# Patient Record
Sex: Female | Born: 1955 | Race: Black or African American | Hispanic: No | Marital: Single | Smoking: Never smoker
Health system: Southern US, Community
[De-identification: ages and names within clinical notes are randomized; demographics above are authoritative.]

## PROBLEM LIST (undated history)

## (undated) DIAGNOSIS — E119 Type 2 diabetes mellitus without complications: Secondary | ICD-10-CM

## (undated) DIAGNOSIS — Z87442 Personal history of urinary calculi: Secondary | ICD-10-CM

## (undated) DIAGNOSIS — M1990 Unspecified osteoarthritis, unspecified site: Secondary | ICD-10-CM

## (undated) DIAGNOSIS — Z8719 Personal history of other diseases of the digestive system: Secondary | ICD-10-CM

## (undated) DIAGNOSIS — I10 Essential (primary) hypertension: Secondary | ICD-10-CM

## (undated) HISTORY — PX: UTERINE FIBROID SURGERY: SHX826

## (undated) HISTORY — PX: APPENDECTOMY: SHX54

## (undated) HISTORY — PX: HERNIA REPAIR: SHX51

## (undated) HISTORY — PX: ABDOMINAL HYSTERECTOMY: SHX81

---

## 2009-06-05 ENCOUNTER — Emergency Department (HOSPITAL_BASED_OUTPATIENT_CLINIC_OR_DEPARTMENT_OTHER): Admission: EM | Admit: 2009-06-05 | Discharge: 2009-06-06 | Payer: Self-pay | Admitting: Emergency Medicine

## 2009-06-05 ENCOUNTER — Ambulatory Visit: Payer: Self-pay | Admitting: Diagnostic Radiology

## 2010-05-19 LAB — URINE MICROSCOPIC-ADD ON

## 2010-05-19 LAB — CBC
Hemoglobin: 12.6 g/dL (ref 12.0–15.0)
Platelets: 266 10*3/uL (ref 150–400)
WBC: 7.1 10*3/uL (ref 4.0–10.5)

## 2010-05-19 LAB — COMPREHENSIVE METABOLIC PANEL
ALT: 10 U/L (ref 0–35)
Albumin: 4.2 g/dL (ref 3.5–5.2)
BUN: 19 mg/dL (ref 6–23)
CO2: 29 mEq/L (ref 19–32)
Chloride: 106 mEq/L (ref 96–112)
GFR calc Af Amer: >60 mL/min (ref >60)
GFR calc non Af Amer: 58 mL/min — ABNORMAL LOW (ref >60)
Total Bilirubin: 0.4 mg/dL (ref 0.3–1.2)

## 2010-05-19 LAB — URINALYSIS, ROUTINE W REFLEX MICROSCOPIC
Ketones, ur: NEGATIVE mg/dL
Urobilinogen, UA: 1 mg/dL (ref 0.0–1.0)
pH: 6.5 (ref 5.0–8.0)

## 2010-05-19 LAB — POCT CARDIAC MARKERS: Myoglobin, poc: 36.7 ng/mL (ref 12–200)

## 2010-05-19 LAB — DIFFERENTIAL
Basophils Absolute: 0.1 10*3/uL (ref 0.0–0.1)
Basophils Relative: 1 % (ref 0–1)
Eosinophils Absolute: 0.3 10*3/uL (ref 0.0–0.7)
Lymphs Abs: 3.4 10*3/uL (ref 0.7–4.0)
Monocytes Absolute: 0.7 10*3/uL (ref 0.1–1.0)
Monocytes Relative: 11 % (ref 3–12)
Neutro Abs: 2.6 10*3/uL (ref 1.7–7.7)
Neutrophils Relative %: 37 % — ABNORMAL LOW (ref 43–77)

## 2010-05-19 LAB — URINE CULTURE

## 2010-05-19 LAB — LIPASE, BLOOD: Lipase: 143 U/L (ref 23–300)

## 2011-12-22 ENCOUNTER — Encounter (HOSPITAL_BASED_OUTPATIENT_CLINIC_OR_DEPARTMENT_OTHER): Payer: Self-pay | Admitting: *Deleted

## 2011-12-22 ENCOUNTER — Emergency Department (HOSPITAL_BASED_OUTPATIENT_CLINIC_OR_DEPARTMENT_OTHER)
Admission: EM | Admit: 2011-12-22 | Discharge: 2011-12-23 | Disposition: A | Discharge status: Home / Self Care | Payer: BC Managed Care – PPO | Attending: Emergency Medicine | Admitting: Emergency Medicine

## 2011-12-22 ENCOUNTER — Emergency Department (HOSPITAL_BASED_OUTPATIENT_CLINIC_OR_DEPARTMENT_OTHER): Payer: BC Managed Care – PPO

## 2011-12-22 DIAGNOSIS — R296 Repeated falls: Secondary | ICD-10-CM | POA: Insufficient documentation

## 2011-12-22 DIAGNOSIS — Y9289 Other specified places as the place of occurrence of the external cause: Secondary | ICD-10-CM | POA: Insufficient documentation

## 2011-12-22 DIAGNOSIS — S93409A Sprain of unspecified ligament of unspecified ankle, initial encounter: Secondary | ICD-10-CM | POA: Insufficient documentation

## 2011-12-22 DIAGNOSIS — Y9301 Activity, walking, marching and hiking: Secondary | ICD-10-CM | POA: Insufficient documentation

## 2011-12-22 DIAGNOSIS — E119 Type 2 diabetes mellitus without complications: Secondary | ICD-10-CM | POA: Insufficient documentation

## 2011-12-22 DIAGNOSIS — I10 Essential (primary) hypertension: Secondary | ICD-10-CM | POA: Insufficient documentation

## 2011-12-22 HISTORY — DX: Type 2 diabetes mellitus without complications: E11.9

## 2011-12-22 HISTORY — DX: Essential (primary) hypertension: I10

## 2011-12-22 NOTE — ED Notes (Signed)
Fall last night while wearing heels. She stepped off the driveway into grass. Twisted her left foot. Pain in her foot and ankle.

## 2011-12-23 MED ORDER — HYDROCODONE-ACETAMINOPHEN 5-325 MG PO TABS
1.0000 | ORAL_TABLET | Freq: Once | ORAL | Status: AC
  Administered 2011-12-23: 1 via ORAL
  Filled 2011-12-23: qty 1

## 2011-12-23 MED ORDER — HYDROCODONE-ACETAMINOPHEN 5-325 MG PO TABS: 1.0000 | ORAL_TABLET | Freq: Four times a day (QID) | ORAL | Status: DC | PRN

## 2011-12-23 NOTE — ED Provider Notes (Signed)
History     CSN: [id number]  Arrival date & time 12/22/11  2139   First MD Initiated Contact with Patient 12/22/11 2243      Chief Complaint  Patient presents with  . Fall    (Consider location/radiation/quality/duration/timing/severity/associated sxs/prior treatment) Patient is a 56 y.o. female presenting with fall. The history is provided by the patient.  Fall The accident occurred yesterday. The fall occurred while walking. Pertinent negatives include no fever, no abdominal pain, no nausea, no vomiting, no hematuria and no headaches.   patient inverted her left ankle and twisted her foot while walking in high heels she stepped off the driveway into the grass. Pain in her foot and ankle. This occurred more than 24 hours ago. Patient did go to work last evening and the pain got worse and the swelling got worse. Patient's now concerned that she has a fracture. No other injuries. The foot pain is 10 out of 10 she is able to walk on it and able to stand up on the foot with some discomfort not severe discomfort. No proximal fibular tenderness no radiation of pain into the proximal leg.  Past Medical History  Diagnosis Date  . Diabetes mellitus without complication   . Hypertension     History reviewed. No pertinent past surgical history.  No family history on file.  History  Substance Use Topics  . Smoking status: Never Smoker   . Smokeless tobacco: Not on file  . Alcohol Use: No    OB History    Grav Para Term Preterm Abortions TAB SAB Ect Mult Living                  Review of Systems  Constitutional: Negative for fever.  HENT: Negative for neck pain.   Eyes: Negative for redness.  Respiratory: Negative for shortness of breath.   Cardiovascular: Negative for chest pain.  Gastrointestinal: Negative for nausea, vomiting and abdominal pain.  Genitourinary: Negative for hematuria.  Musculoskeletal: Positive for joint swelling. Negative for back pain.  Skin: Negative  for rash.  Neurological: Negative for headaches.  Hematological: Does not bruise/bleed easily.    Allergies  Sulfa antibiotics  Home Medications   Current Outpatient Rx  Name Route Sig Dispense Refill  . LISINOPRIL 5 MG PO TABS Oral Take 5 mg by mouth daily.    Marland Kitchen PRAVASTATIN SODIUM 40 MG PO TABS Oral Take 40 mg by mouth daily.    Marland Kitchen JANUMET PO Oral Take by mouth.    Marland Kitchen HYDROCODONE-ACETAMINOPHEN 5-325 MG PO TABS Oral Take 1-2 tablets by mouth every 6 (six) hours as needed for pain. 20 tablet 0    BP 153/64  Pulse 71  Temp 97.9 F (36.6 C) (Oral)  Resp 18  SpO2 100%  Physical Exam  Nursing note and vitals reviewed. Constitutional: She is oriented to person, place, and time. She appears well-developed and well-nourished. No distress.  HENT:  Head: Normocephalic and atraumatic.  Mouth/Throat: Oropharynx is clear and moist.  Eyes: Conjunctivae normal and EOM are normal. Pupils are equal, round, and reactive to light.  Neck: Normal range of motion. Neck supple.  Cardiovascular: Normal rate, regular rhythm, normal heart sounds and intact distal pulses.   No murmur heard. Pulmonary/Chest: Effort normal and breath sounds normal.  Abdominal: Soft. Bowel sounds are normal. There is no tenderness.  Musculoskeletal: She exhibits edema and tenderness.       Normal except for the left foot and ankle swelling of the ankle mostly laterally  swelling to the top of the foot. Tenderness along the lateral malleolus. Distally neurocirculatory is intact cap refill is less than 2 seconds dorsalis pedis pulse is 2+. No proximal fibular tenderness.  Neurological: She is alert and oriented to person, place, and time. No cranial nerve deficit. She exhibits normal muscle tone. Coordination normal.  Skin: Skin is warm. No rash noted.    ED Course  Procedures (including critical care time)  Labs Reviewed - No data to display Dg Ankle Complete Left  12/22/2011  *RADIOLOGY REPORT*  Clinical Data: Foot  and ankle pain.  LEFT ANKLE COMPLETE - 3+ VIEW  Comparison: Left foot 12/22/2011  Findings: Three views of the ankle were obtained.  There is no evidence for an acute fracture dislocation.  There is a prominent calcaneal spur along the plantar aspect.  Prominent linear density or ossification along the dorsal aspect of the navicular bone.  IMPRESSION: No definite fracture or dislocation.  Prominent linear density or ossification along the dorsal aspect of the navicular bone is likely chronic.   Original Report Authenticated By: Richarda Overlie, M.D.    Dg Foot Complete Left  12/22/2011  *RADIOLOGY REPORT*  Clinical Data: Foot and ankle pain.  LEFT FOOT - COMPLETE 3+ VIEW  Comparison: Left ankle 12/22/2011  Findings: Three views of the foot were obtained.  Difficult to exclude lucencies involving the base of the third and fourth metatarsals on the AP view.  These areas appear normal on the other views.  Overall alignment of the foot is normal.  There is a prominent plantar calcaneal spur.  No gross soft tissue swelling along the tarsometatarsal joints.  IMPRESSION: No definite fracture or dislocation.  Difficult to exclude a subtle injury along the base of the third and fourth metatarsals as described.  Recommend clinical correlation in this area.   Original Report Authenticated By: Richarda Overlie, M.D.      1. Ankle sprain       MDM  Patient with foot inversion injury yesterday more than 24 hours ago while wearing heel she stepped off the driveway and grasped this with her left foot. Patient did go to work at night she works overnight and then had a lot of foot pain and ankle pain I morning. And swelling. X-rays are negative for any acute fracture appears to be a left ankle sprain. Will treat today is so elevation pain medicine and orthopedic followup as needed work note provided. No proximal fibula tenderness.        Shelda Jakes, MD 12/23/11 0010

## 2012-08-04 DIAGNOSIS — I10 Essential (primary) hypertension: Secondary | ICD-10-CM | POA: Insufficient documentation

## 2012-08-04 DIAGNOSIS — E785 Hyperlipidemia, unspecified: Secondary | ICD-10-CM | POA: Insufficient documentation

## 2012-08-04 DIAGNOSIS — E119 Type 2 diabetes mellitus without complications: Secondary | ICD-10-CM | POA: Insufficient documentation

## 2012-08-04 DIAGNOSIS — B009 Herpesviral infection, unspecified: Secondary | ICD-10-CM | POA: Insufficient documentation

## 2012-08-04 DIAGNOSIS — E782 Mixed hyperlipidemia: Secondary | ICD-10-CM | POA: Insufficient documentation

## 2013-06-07 DIAGNOSIS — A048 Other specified bacterial intestinal infections: Secondary | ICD-10-CM | POA: Insufficient documentation

## 2013-07-31 DIAGNOSIS — N390 Urinary tract infection, site not specified: Secondary | ICD-10-CM | POA: Insufficient documentation

## 2013-07-31 DIAGNOSIS — R319 Hematuria, unspecified: Secondary | ICD-10-CM | POA: Insufficient documentation

## 2013-07-31 DIAGNOSIS — R300 Dysuria: Secondary | ICD-10-CM | POA: Insufficient documentation

## 2013-10-09 DIAGNOSIS — M25559 Pain in unspecified hip: Secondary | ICD-10-CM | POA: Insufficient documentation

## 2013-11-18 DIAGNOSIS — M5136 Other intervertebral disc degeneration, lumbar region: Secondary | ICD-10-CM | POA: Insufficient documentation

## 2013-11-18 DIAGNOSIS — M1611 Unilateral primary osteoarthritis, right hip: Secondary | ICD-10-CM | POA: Insufficient documentation

## 2013-11-18 DIAGNOSIS — IMO0002 Reserved for concepts with insufficient information to code with codable children: Secondary | ICD-10-CM | POA: Insufficient documentation

## 2013-11-24 ENCOUNTER — Encounter (HOSPITAL_BASED_OUTPATIENT_CLINIC_OR_DEPARTMENT_OTHER): Payer: Self-pay | Admitting: Emergency Medicine

## 2013-11-24 ENCOUNTER — Emergency Department (HOSPITAL_BASED_OUTPATIENT_CLINIC_OR_DEPARTMENT_OTHER)
Admission: EM | Admit: 2013-11-24 | Discharge: 2013-11-24 | Disposition: A | Discharge status: Home / Self Care | Payer: Managed Care, Other (non HMO) | Attending: Emergency Medicine | Admitting: Emergency Medicine

## 2013-11-24 DIAGNOSIS — Z79899 Other long term (current) drug therapy: Secondary | ICD-10-CM | POA: Diagnosis not present

## 2013-11-24 DIAGNOSIS — R21 Rash and other nonspecific skin eruption: Secondary | ICD-10-CM | POA: Insufficient documentation

## 2013-11-24 DIAGNOSIS — E119 Type 2 diabetes mellitus without complications: Secondary | ICD-10-CM | POA: Insufficient documentation

## 2013-11-24 DIAGNOSIS — I10 Essential (primary) hypertension: Secondary | ICD-10-CM | POA: Insufficient documentation

## 2013-11-24 MED ORDER — TRIAMCINOLONE ACETONIDE 0.1 % EX CREA: 1.0000 "application " | TOPICAL_CREAM | Freq: Two times a day (BID) | CUTANEOUS | Status: DC

## 2013-11-24 NOTE — ED Provider Notes (Signed)
CSN: [id number]     Arrival date & time 11/24/13  1329 History   First MD Initiated Contact with Patient 11/24/13 1335     Chief Complaint  Patient presents with  . Rash      HPI  Pt presents with a rash to her right forearm.  Stay she was at the beach last week. Her rash started. She received a call from work that a patient on her floor had scabies. 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 She was wearing gloves. Has a rash only on her left forearm. No other areas of symptoms. No known exposures by staying this.  Past Medical History  Diagnosis Date  . Diabetes mellitus without complication   . Hypertension    History reviewed. No pertinent past surgical history. No family history on file. History  Substance Use Topics  . Smoking status: Never Smoker   . Smokeless tobacco: Not on file  . Alcohol Use: No   OB History   Grav Para Term Preterm Abortions TAB SAB Ect Mult Living                 Review of Systems  Constitutional: Negative for fever, chills, diaphoresis, appetite change and fatigue.  HENT: Negative for mouth sores, sore throat and trouble swallowing.   Eyes: Negative for visual disturbance.  Respiratory: Negative for cough, chest tightness, shortness of breath and wheezing.   Cardiovascular: Negative for chest pain.  Gastrointestinal: Negative for nausea, vomiting, abdominal pain, diarrhea and abdominal distention.  Endocrine: Negative for polydipsia, polyphagia and polyuria.  Genitourinary: Negative for dysuria, frequency and hematuria.  Musculoskeletal: Negative for gait problem.  Skin: Positive for rash. Negative for color change and pallor.  Neurological: Negative for dizziness, syncope, light-headedness and headaches.  Hematological: Does not bruise/bleed easily.  Psychiatric/Behavioral: Negative for behavioral problems and confusion.      Allergies  Sulfa antibiotics  Home Medications   Prior to Admission medications    Medication Sig Start Date End Date Taking? Authorizing Provider  lisinopril (PRINIVIL,ZESTRIL) 5 MG tablet Take 5 mg by mouth daily.   Yes Historical Provider, MD  pravastatin (PRAVACHOL) 40 MG tablet Take 40 mg by mouth daily.   Yes Historical Provider, MD  SitaGLIPtin-MetFORMIN HCl (JANUMET PO) Take by mouth.   Yes Historical Provider, MD  HYDROcodone-acetaminophen (NORCO/VICODIN) 5-325 MG per tablet Take 1-2 tablets by mouth every 6 (six) hours as needed for pain. 12/23/11   Vanetta Mulders, MD  triamcinolone cream (KENALOG) 0.1 % Apply 1 application topically 2 (two) times daily. 11/24/13   Rolland Porter, MD   BP 182/77  Pulse 77  Temp(Src) 98.9 F (37.2 C) (Oral)  Resp 18  Ht  (1.626 m)  Wt 155 lb (70.308 kg)  BMI 26.59 kg/m2  SpO2 100% Physical Exam  Skin: Rash noted.  Small white papules to the left forearm. Nondermatomal. Not zoster. No excoriated. Not reddened.  No other areas of rash on the hands arms trunk or legs.    ED Course  Procedures (including critical care time) Labs Review Labs Reviewed - No data to display  Imaging Review No results found.   EKG Interpretation None      MDM   Final diagnoses:  Rash    Non specific rash.  Pt with exposure at work to scabies.  Declines treatment at this time because "Theyr'e gonna give [place] the scabies cream at work for free". Rash is not markedly excoriated.  Is not her typical for scabies. Plan will be simple triamcinolone cream for her pruritis     Rolland Porter, MD 11/24/13 1355

## 2013-11-24 NOTE — ED Notes (Signed)
D/c home with Rx x 1 for triamcinolone cream

## 2013-11-24 NOTE — ED Notes (Signed)
Pt presents to ED with complaints of rash on left arm after leaving the beach. Pt states rash has been there since Wednesday.

## 2013-11-24 NOTE — Discharge Instructions (Signed)

## 2013-12-25 DIAGNOSIS — N3281 Overactive bladder: Secondary | ICD-10-CM | POA: Insufficient documentation

## 2013-12-25 DIAGNOSIS — R110 Nausea: Secondary | ICD-10-CM | POA: Insufficient documentation

## 2014-01-10 DIAGNOSIS — B353 Tinea pedis: Secondary | ICD-10-CM | POA: Insufficient documentation

## 2014-06-19 ENCOUNTER — Encounter (HOSPITAL_BASED_OUTPATIENT_CLINIC_OR_DEPARTMENT_OTHER): Payer: Self-pay | Admitting: Emergency Medicine

## 2014-06-19 ENCOUNTER — Emergency Department (HOSPITAL_BASED_OUTPATIENT_CLINIC_OR_DEPARTMENT_OTHER): Payer: Managed Care, Other (non HMO)

## 2014-06-19 ENCOUNTER — Emergency Department (HOSPITAL_BASED_OUTPATIENT_CLINIC_OR_DEPARTMENT_OTHER)
Admission: EM | Admit: 2014-06-19 | Discharge: 2014-06-20 | Disposition: A | Discharge status: Home / Self Care | Payer: Managed Care, Other (non HMO) | Attending: Emergency Medicine | Admitting: Emergency Medicine

## 2014-06-19 DIAGNOSIS — R0789 Other chest pain: Secondary | ICD-10-CM | POA: Insufficient documentation

## 2014-06-19 DIAGNOSIS — R079 Chest pain, unspecified: Secondary | ICD-10-CM | POA: Diagnosis present

## 2014-06-19 DIAGNOSIS — I10 Essential (primary) hypertension: Secondary | ICD-10-CM | POA: Diagnosis not present

## 2014-06-19 DIAGNOSIS — E119 Type 2 diabetes mellitus without complications: Secondary | ICD-10-CM | POA: Diagnosis not present

## 2014-06-19 DIAGNOSIS — R110 Nausea: Secondary | ICD-10-CM | POA: Insufficient documentation

## 2014-06-19 DIAGNOSIS — R0602 Shortness of breath: Secondary | ICD-10-CM | POA: Diagnosis not present

## 2014-06-19 DIAGNOSIS — Z79899 Other long term (current) drug therapy: Secondary | ICD-10-CM | POA: Diagnosis not present

## 2014-06-19 DIAGNOSIS — R51 Headache: Secondary | ICD-10-CM | POA: Insufficient documentation

## 2014-06-19 DIAGNOSIS — R05 Cough: Secondary | ICD-10-CM | POA: Diagnosis not present

## 2014-06-19 LAB — CBC
HCT: 36.9 % (ref 36.0–46.0)
HEMOGLOBIN: 12.6 g/dL (ref 12.0–15.0)
MCH: 28.1 pg (ref 26.0–34.0)
MCHC: 34.1 g/dL (ref 30.0–36.0)
MCV: 82.2 fL (ref 78.0–100.0)
PLATELETS: 218 10*3/uL (ref 150–400)
RBC: 4.49 MIL/uL (ref 3.87–5.11)
RDW: 12.9 % (ref 11.5–15.5)
WBC: 8 10*3/uL (ref 4.0–10.5)

## 2014-06-19 LAB — BASIC METABOLIC PANEL
ANION GAP: 8 (ref 5–15)
BUN: 17 mg/dL (ref 6–23)
CHLORIDE: 104 mmol/L (ref 96–112)
CO2: 25 mmol/L (ref 19–32)
Calcium: 9.1 mg/dL (ref 8.4–10.5)
Creatinine, Ser: 0.82 mg/dL (ref 0.50–1.10)
GFR calc non Af Amer: 78 mL/min — ABNORMAL LOW (ref >90)
Glucose, Bld: 98 mg/dL (ref 70–99)
POTASSIUM: 5.6 mmol/L — AB (ref 3.5–5.1)
SODIUM: 137 mmol/L (ref 135–145)

## 2014-06-19 LAB — BRAIN NATRIURETIC PEPTIDE: B NATRIURETIC PEPTIDE 5: 45.3 pg/mL (ref 0.0–100.0)

## 2014-06-19 LAB — TROPONIN I

## 2014-06-19 MED ORDER — ASPIRIN 81 MG PO CHEW
324.0000 mg | CHEWABLE_TABLET | Freq: Once | ORAL | Status: AC
  Administered 2014-06-19: 324 mg via ORAL
  Filled 2014-06-19: qty 4

## 2014-06-19 MED ORDER — NITROGLYCERIN 0.4 MG SL SUBL
0.4000 mg | SUBLINGUAL_TABLET | SUBLINGUAL | Status: DC | PRN
  Administered 2014-06-19: 0.4 mg via SUBLINGUAL
  Filled 2014-06-19: qty 1

## 2014-06-19 MED ORDER — MORPHINE SULFATE 4 MG/ML IJ SOLN
4.0000 mg | Freq: Once | INTRAMUSCULAR | Status: AC
  Administered 2014-06-19: 4 mg via INTRAVENOUS
  Filled 2014-06-19: qty 1

## 2014-06-19 MED ORDER — SODIUM CHLORIDE 0.9 % IV BOLUS (SEPSIS)
1000.0000 mL | Freq: Once | INTRAVENOUS | Status: AC
  Administered 2014-06-19: 1000 mL via INTRAVENOUS

## 2014-06-19 NOTE — ED Notes (Signed)
Pt reports cramping pain to central chest that radiates to left chest and down left arm, burning, right arm also cramping, + sob, nausea

## 2014-06-19 NOTE — ED Provider Notes (Signed)
CSN: [id number]     Arrival date & time 06/18/14  2039 History  This chart was scribed for Sharon MochaBlair Arizona Nordquist, MD by Gwenyth Oberatherine Macek, ED Scribe. This patient was seen in room MH01/MH01 and the patient's care was started at 9:02 PM.   Chief Complaint  Patient presents with  . Chest Pain   Patient is a 59 y.o. female presenting with chest pain. The history is provided by the patient. No language interpreter was used.  Chest Pain Pain location:  Substernal area and L chest Pain quality: pressure and radiating   Pain radiates to:  L shoulder and L arm Pain radiates to the back: no   Pain severity:  Moderate Onset quality:  Gradual Duration:  2 days Timing:  Intermittent Progression:  Unchanged Chronicity:  New Relieved by:  Nothing Worsened by:  Exertion Ineffective treatments:  None tried Associated symptoms: cough, headache, nausea and shortness of breath   Associated symptoms: no fever     HPI Comments: Sharon Leonard is a 59 y.o. female accompanied by her sister, with a history of DM and HTN, who presents to the Emergency Department complaining of intermittent, 3/10, pressure-like, central chest pain, that radiates to her left shoulder and down her left arm, and started yesterday. She reports SOB, intermittent cough, HA and nausea as associated symptoms. Pt states that episodes of CP last a few minutes before resolving on their own. Her current pain is 3/10. Pt notes that her symptoms become worse with exertion. 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 Pt denies a family history of CAD. She also denies fever as an associated symptom.  Past Medical History  Diagnosis Date  . Diabetes mellitus without complication   . Hypertension    History reviewed. No pertinent past surgical history. History reviewed. No pertinent family history. History  Substance Use Topics  . Smoking status: Never Smoker   . Smokeless tobacco: Not on file  .  Alcohol Use: No   OB History    No data available     Review of Systems  Constitutional: Negative for fever.  Respiratory: Positive for cough and shortness of breath.   Cardiovascular: Positive for chest pain.  Gastrointestinal: Positive for nausea.  Neurological: Positive for headaches.  All other systems reviewed and are negative.  Allergies  Sulfa antibiotics  Home Medications   Prior to Admission medications   Medication Sig Start Date End Date Taking? Authorizing Provider  cetirizine (ZYRTEC) 10 MG tablet Take 10 mg by mouth daily.   Yes Historical Provider, MD  lisinopril (PRINIVIL,ZESTRIL) 5 MG tablet Take 5 mg by mouth daily.   Yes Historical Provider, MD  pravastatin (PRAVACHOL) 40 MG tablet Take 40 mg by mouth daily.   Yes Historical Provider, MD  SitaGLIPtin-MetFORMIN HCl (JANUMET PO) Take by mouth.   Yes Historical Provider, MD  HYDROcodone-acetaminophen (NORCO/VICODIN) 5-325 MG per tablet Take 1-2 tablets by mouth every 6 (six) hours as needed for pain. 12/23/11   Vanetta MuldersScott Zackowski, MD  triamcinolone cream (KENALOG) 0.1 % Apply 1 application topically 2 (two) times daily. 11/24/13   Rolland PorterMark James, MD   BP 160/75 mmHg  Pulse 65  Temp(Src) 97.7 F (36.5 C) (Oral)  Resp 18  Ht 5\' 4"  (1.626 m)  Wt 150 lb (68.04 kg)  BMI 25.73 kg/m2  SpO2 100% Physical Exam  Constitutional: She is oriented to person, place, and time. She appears well-developed and well-nourished. No distress.  HENT:  Head: Normocephalic and atraumatic.  Mouth/Throat: Oropharynx is clear and moist.  Eyes: EOM are normal. Pupils are equal, round, and reactive to light.  Neck: Normal range of motion. Neck supple.  Cardiovascular: Normal rate and regular rhythm.  Exam reveals no friction rub.   No murmur heard. Pulmonary/Chest: Effort normal and breath sounds normal. No respiratory distress. She has no wheezes. She has no rales. She exhibits no tenderness.  Abdominal: Soft. She exhibits no distension.  There is no tenderness. There is no rebound.  Musculoskeletal: Normal range of motion. She exhibits no edema.  Neurological: She is alert and oriented to person, place, and time.  Skin: She is not diaphoretic.  Nursing note and vitals reviewed.   ED Course  Procedures   DIAGNOSTIC STUDIES: Oxygen Saturation is 100% on RA, normal by my interpretation.    COORDINATION OF CARE: 9:06 PM Discussed treatment plan with pt at bedside and pt agreed to plan.   Labs Review Labs Reviewed - No data to display  Imaging Review Dg Chest 2 View  06/19/2014   CLINICAL DATA:  Mid chest pain radiating down the left arm. Shortness of breath. Nausea.  EXAM: CHEST  2 VIEW  COMPARISON:  None.  FINDINGS: Borderline enlarged cardiac silhouette. Mildly prominent pulmonary vasculature and interstitial markings. No pleural fluid. Minimal thoracic spine degenerative changes.  IMPRESSION: 1. Borderline cardiomegaly and mild pulmonary vascular congestion. 2. Mild chronic interstitial lung disease with possible mild superimposed interstitial pulmonary edema.   Electronically Signed   By: Beckie Salts M.D.   On: 06/19/2014 21:30     EKG Interpretation   Date/Time:  Thursday June 19 2014 20:45:35 EDT Ventricular Rate:  69 PR Interval:  156 QRS Duration: 84 QT Interval:  402 QTC Calculation: 430 R Axis:   82 Text Interpretation:  Normal sinus rhythm Normal ECG No significant change  since last tracing Confirmed by Gwendolyn Grant  MD, Deziree Mokry ([id number]) on 06/19/2014  8:51:31 PM      MDM   Final diagnoses:  Chest pain, unspecified chest pain type    59 year old female here with chest pain. Began yesterday. Described as a sharp, burning, and pressure raise the left arm. No alleviating or exacerbating factors. Not recent producible. Vitals are stable here. EKG is normal. Stool labs including serial troponins are normal. I discussed with patient that she is a heart score 4 if we consider her story suspicious moderately and  now would recommend admission. She can get close follow-up with her doctor in the next 1-2 days. With 2 negative troponins, feel she is stable for discharge. I offered admission for her but she would rather go home and follow-up with her doctor as an outpatient. I think this is appropriate since some doctors would rule her as a heart score 3 with a slightly suspicious story and somewhat cooler as a heart score of 4 with a moderately suspicious story.  I personally performed the services described in this documentation, which was scribed in my presence. The recorded information has been reviewed and is accurate.     Sharon Mocha, MD 06/20/14 [phone]

## 2014-06-19 NOTE — ED Notes (Signed)
Pt placed on heart monitor.

## 2014-06-20 LAB — TROPONIN I

## 2014-06-20 NOTE — Discharge Instructions (Signed)

## 2015-06-26 ENCOUNTER — Emergency Department (HOSPITAL_BASED_OUTPATIENT_CLINIC_OR_DEPARTMENT_OTHER): Payer: Managed Care, Other (non HMO)

## 2015-06-26 ENCOUNTER — Emergency Department (HOSPITAL_BASED_OUTPATIENT_CLINIC_OR_DEPARTMENT_OTHER)
Admission: EM | Admit: 2015-06-26 | Discharge: 2015-06-26 | Disposition: A | Discharge status: Home / Self Care | Payer: Managed Care, Other (non HMO) | Attending: Emergency Medicine | Admitting: Emergency Medicine

## 2015-06-26 ENCOUNTER — Emergency Department (HOSPITAL_COMMUNITY)
Admission: EM | Admit: 2015-06-26 | Discharge: 2015-06-26 | Disposition: A | Discharge status: Home / Self Care | Payer: Managed Care, Other (non HMO)

## 2015-06-26 ENCOUNTER — Encounter (HOSPITAL_BASED_OUTPATIENT_CLINIC_OR_DEPARTMENT_OTHER): Payer: Self-pay | Admitting: Emergency Medicine

## 2015-06-26 DIAGNOSIS — E119 Type 2 diabetes mellitus without complications: Secondary | ICD-10-CM | POA: Insufficient documentation

## 2015-06-26 DIAGNOSIS — R0602 Shortness of breath: Secondary | ICD-10-CM | POA: Diagnosis not present

## 2015-06-26 DIAGNOSIS — R110 Nausea: Secondary | ICD-10-CM | POA: Diagnosis not present

## 2015-06-26 DIAGNOSIS — R600 Localized edema: Secondary | ICD-10-CM | POA: Insufficient documentation

## 2015-06-26 DIAGNOSIS — R0789 Other chest pain: Secondary | ICD-10-CM | POA: Insufficient documentation

## 2015-06-26 DIAGNOSIS — I10 Essential (primary) hypertension: Secondary | ICD-10-CM | POA: Diagnosis not present

## 2015-06-26 LAB — BASIC METABOLIC PANEL
ANION GAP: 5 (ref 5–15)
BUN: 13 mg/dL (ref 6–20)
CO2: 22 mmol/L (ref 22–32)
Calcium: 8.6 mg/dL — ABNORMAL LOW (ref 8.9–10.3)
Chloride: 110 mmol/L (ref 101–111)
Creatinine, Ser: 0.61 mg/dL (ref 0.44–1.00)
GFR calc Af Amer: >60 mL/min (ref >60)
Glucose, Bld: 126 mg/dL — ABNORMAL HIGH (ref 65–99)
POTASSIUM: 4.3 mmol/L (ref 3.5–5.1)
SODIUM: 137 mmol/L (ref 135–145)

## 2015-06-26 LAB — CBC
HCT: 36.1 % (ref 36.0–46.0)
HEMOGLOBIN: 12.8 g/dL (ref 12.0–15.0)
MCH: 28.8 pg (ref 26.0–34.0)
MCHC: 35.5 g/dL (ref 30.0–36.0)
MCV: 81.3 fL (ref 78.0–100.0)
Platelets: 193 10*3/uL (ref 150–400)
RBC: 4.44 MIL/uL (ref 3.87–5.11)
RDW: 13.2 % (ref 11.5–15.5)
WBC: 4.6 10*3/uL (ref 4.0–10.5)

## 2015-06-26 LAB — BRAIN NATRIURETIC PEPTIDE: B NATRIURETIC PEPTIDE 5: 15.5 pg/mL (ref 0.0–100.0)

## 2015-06-26 LAB — TROPONIN I: Troponin I: <0.03 ng/mL (ref <0.031)

## 2015-06-26 MED ORDER — KETOROLAC TROMETHAMINE 30 MG/ML IJ SOLN
30.0000 mg | Freq: Once | INTRAMUSCULAR | Status: AC
  Administered 2015-06-26: 30 mg via INTRAVENOUS
  Filled 2015-06-26: qty 1

## 2015-06-26 MED ORDER — IBUPROFEN 600 MG PO TABS: 600.0000 mg | ORAL_TABLET | Freq: Three times a day (TID) | ORAL | Status: DC

## 2015-06-26 NOTE — ED Provider Notes (Signed)
CSN: [id number]     Arrival date & time 06/26/15  1211 History   First MD Initiated Contact with Patient 06/26/15 1502     Chief Complaint  Patient presents with  . Chest Pain     (Consider location/radiation/quality/duration/timing/severity/associated sxs/prior Treatment) HPI Patient presents with episodic sharp central chest pain. This been going on for the past 3 weeks. Worse with movement and palpation. She has some mild shortness of breath associated with this. She's complaining of no pain or shortness of breath at this time. She's had no new lower extremity swelling or asymmetry. 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 No history of smoking or cardiac disease in family history. Patient has no recent extended travel or immobilization. No family history of DVT/PE. Past Medical History  Diagnosis Date  . Diabetes mellitus without complication (HCC)   . Hypertension    History reviewed. No pertinent past surgical history. History reviewed. No pertinent family history. Social History  Substance Use Topics  . Smoking status: Never Smoker   . Smokeless tobacco: None  . Alcohol Use: No   OB History    No data available     Review of Systems  Constitutional: Negative for fever and chills.  Respiratory: Positive for shortness of breath. Negative for cough and chest tightness.   Cardiovascular: Positive for chest pain and leg swelling. Negative for palpitations.  Gastrointestinal: Positive for nausea. Negative for vomiting, abdominal pain and diarrhea.  Musculoskeletal: Negative for myalgias, back pain, neck pain and neck stiffness.  Skin: Negative for rash and wound.  Neurological: Negative for dizziness, weakness, light-headedness, numbness and headaches.  All other systems reviewed and are negative.     Allergies  Raspberry; Shellfish allergy; and Sulfa antibiotics  Home Medications   Prior to Admission medications    Medication Sig Start Date End Date Taking? Authorizing Provider  cetirizine (ZYRTEC) 10 MG tablet Take 10 mg by mouth daily.    Historical Provider, MD  HYDROcodone-acetaminophen (NORCO/VICODIN) 5-325 MG per tablet Take 1-2 tablets by mouth every 6 (six) hours as needed for pain. 12/23/11   Vanetta MuldersScott Zackowski, MD  ibuprofen (ADVIL,MOTRIN) 600 MG tablet Take 1 tablet (600 mg total) by mouth 3 (three) times daily after meals. 06/26/15   Loren Raceravid Rosaleigh Brazzel, MD  lisinopril (PRINIVIL,ZESTRIL) 5 MG tablet Take 5 mg by mouth daily.    Historical Provider, MD  pravastatin (PRAVACHOL) 40 MG tablet Take 40 mg by mouth daily.    Historical Provider, MD  SitaGLIPtin-MetFORMIN HCl (JANUMET PO) Take by mouth.    Historical Provider, MD  triamcinolone cream (KENALOG) 0.1 % Apply 1 application topically 2 (two) times daily. 11/24/13   Rolland PorterMark James, MD   BP 151/66 mmHg  Pulse 59  Temp(Src) 98.4 F (36.9 C) (Oral)  Resp 11  Ht 5\' 4"  (1.626 m)  Wt 158 lb (71.668 kg)  BMI 27.11 kg/m2  SpO2 98% Physical Exam  Constitutional: She is oriented to person, place, and time. She appears well-developed and well-nourished. No distress.  HENT:  Head: Normocephalic and atraumatic.  Mouth/Throat: Oropharynx is clear and moist. No oropharyngeal exudate.  Eyes: EOM are normal. Pupils are equal, round, and reactive to light.  Neck: Normal range of motion. Neck supple. No JVD present.  Cardiovascular: Normal rate and regular rhythm.  Exam reveals no gallop and no friction rub.   No murmur heard. Pulmonary/Chest: Effort normal. No respiratory distress. She has no wheezes. She has rales (  few crackles in bilateral bases). She exhibits tenderness (chest pain is completely reproduced with palpation over the inferior border of the sternum. There is no crepitance or deformity.).  Abdominal: Soft. Bowel sounds are normal. She exhibits no distension and no mass. There is no tenderness. There is no rebound and no guarding.  Musculoskeletal:  Normal range of motion. She exhibits edema. She exhibits no tenderness.  1+ bilateral pitting edema to the ankles. She has no calf swelling, asymmetry or tenderness. Distal pulses are equal and intact.  Neurological: She is alert and oriented to person, place, and time.  Moves all extremities without deficit. Sensation is fully intact.  Skin: Skin is warm and dry. No rash noted. No erythema.  Psychiatric: She has a normal mood and affect. Her behavior is normal.  Nursing note and vitals reviewed.   ED Course  Procedures (including critical care time) Labs Review Labs Reviewed  BASIC METABOLIC PANEL - Abnormal; Notable for the following:    Glucose, Bld 126 (*)    Calcium 8.6 (*)    All other components within normal limits  CBC  TROPONIN I  BRAIN NATRIURETIC PEPTIDE  TROPONIN I    Imaging Review No results found. I have personally reviewed and evaluated these images and lab results as part of my medical decision-making.   EKG Interpretation   Date/Time:  Friday June 26 2015 12:20:47 EDT Ventricular Rate:  66 PR Interval:  158 QRS Duration: 72 QT Interval:  400 QTC Calculation: 419 R Axis:   59 Text Interpretation:  Normal sinus rhythm Normal ECG agree. no change  Confirmed by Donnald Garre, MD, Lebron Conners [phone]) on 06/26/2015 1:29:24 PM      MDM   Final diagnoses:  Anterior chest wall pain    Chest pain is completely reproduced with palpation. EKG without any acute findings. Troponin 2 is normal. Discharge home with return precautions.    Loren Racer, MD 06/29/15 2213

## 2015-06-26 NOTE — ED Notes (Signed)
Patient reports intermittent short episodes of chest pain with nausea.  Reports this is more with activity.

## 2015-06-26 NOTE — Discharge Instructions (Signed)

## 2016-04-11 ENCOUNTER — Encounter (HOSPITAL_BASED_OUTPATIENT_CLINIC_OR_DEPARTMENT_OTHER): Payer: Self-pay | Admitting: Emergency Medicine

## 2016-04-11 ENCOUNTER — Emergency Department (HOSPITAL_BASED_OUTPATIENT_CLINIC_OR_DEPARTMENT_OTHER)
Admission: EM | Admit: 2016-04-11 | Discharge: 2016-04-11 | Disposition: A | Discharge status: Home / Self Care | Payer: 59 | Attending: Emergency Medicine | Admitting: Emergency Medicine

## 2016-04-11 ENCOUNTER — Emergency Department (HOSPITAL_BASED_OUTPATIENT_CLINIC_OR_DEPARTMENT_OTHER): Payer: 59

## 2016-04-11 DIAGNOSIS — B9789 Other viral agents as the cause of diseases classified elsewhere: Secondary | ICD-10-CM

## 2016-04-11 DIAGNOSIS — Z7984 Long term (current) use of oral hypoglycemic drugs: Secondary | ICD-10-CM | POA: Insufficient documentation

## 2016-04-11 DIAGNOSIS — I10 Essential (primary) hypertension: Secondary | ICD-10-CM | POA: Insufficient documentation

## 2016-04-11 DIAGNOSIS — R05 Cough: Secondary | ICD-10-CM | POA: Diagnosis present

## 2016-04-11 DIAGNOSIS — J069 Acute upper respiratory infection, unspecified: Secondary | ICD-10-CM | POA: Diagnosis not present

## 2016-04-11 DIAGNOSIS — Z79899 Other long term (current) drug therapy: Secondary | ICD-10-CM | POA: Diagnosis not present

## 2016-04-11 DIAGNOSIS — E119 Type 2 diabetes mellitus without complications: Secondary | ICD-10-CM | POA: Insufficient documentation

## 2016-04-11 LAB — CBG MONITORING, ED: Glucose-Capillary: 210 mg/dL — ABNORMAL HIGH (ref 65–99)

## 2016-04-11 MED ORDER — FEXOFENADINE-PSEUDOEPHED ER 60-120 MG PO TB12: 1.0000 | ORAL_TABLET | Freq: Two times a day (BID) | ORAL | 0 refills | Status: AC

## 2016-04-11 MED ORDER — DM-GUAIFENESIN ER 30-600 MG PO TB12: 1.0000 | ORAL_TABLET | Freq: Two times a day (BID) | ORAL | 0 refills | Status: AC

## 2016-04-11 MED ORDER — IBUPROFEN 800 MG PO TABS
800.0000 mg | ORAL_TABLET | Freq: Once | ORAL | Status: AC
  Administered 2016-04-11: 800 mg via ORAL
  Filled 2016-04-11: qty 1

## 2016-04-11 NOTE — ED Provider Notes (Signed)
[facility] Provider Note   CSN: [id number] Arrival date & time: 04/11/16  1648  By signing my name below, I, Sharon Leonard, attest that this documentation has been prepared under the direction and in the presence of Sharon Favor, FNP. Electronically Signed: Rosario Leonard, ED Scribe. 04/11/16. 7:52 PM.  History   Chief Complaint Chief Complaint  Patient presents with  . Cough   The history is provided by the patient. No language interpreter was used.  Cough  This is a new problem. The current episode started more than 2 days ago. Episode frequency: persistently. The problem has been gradually worsening. The cough is non-productive. There has been no fever. Associated symptoms include headaches, rhinorrhea, sore throat and myalgias. Treatments tried: Theraflu, Tylenol, and Alkaseltzer. The treatment provided no relief. She is not a smoker.    HPI Comments: Sharon Leonard is a 61 y.o. female with a h/o DM and HTN, who presents to the Emergency Department complaining of persistent, gradually worsening, non-productive cough beginning five days ago. She reports associated fatigue, headache, congestion, rhinorrhea, and generalized myalgias. She has been taking Theraflu, Tylenol, and Alkaseltzer at home without relief of her symptoms. 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 No other associated symptoms or complaints.   Past Medical History:  Diagnosis Date  . Diabetes mellitus without complication (HCC)   . Hypertension    There are no active problems to display for this patient.  Past Surgical History:  Procedure Laterality Date  . ABDOMINAL HYSTERECTOMY     OB History    No data available     Home Medications    Prior to Admission medications   Medication Sig Start Date End Date Taking? Authorizing Provider  cetirizine (ZYRTEC) 10 MG tablet Take 10 mg by mouth daily.   Yes Historical Provider, MD  lisinopril  (PRINIVIL,ZESTRIL) 5 MG tablet Take 5 mg by mouth daily.   Yes Historical Provider, MD  pravastatin (PRAVACHOL) 40 MG tablet Take 40 mg by mouth daily.   Yes Historical Provider, MD  SitaGLIPtin-MetFORMIN HCl (JANUMET PO) Take by mouth.   Yes Historical Provider, MD   Family History History reviewed. No pertinent family history.  Social History Social History  Substance Use Topics  . Smoking status: Never Smoker  . Smokeless tobacco: Never Used  . Alcohol use No   Allergies   Raspberry; Shellfish allergy; Sulfa antibiotics; and Latex  Review of Systems Review of Systems  HENT: Positive for rhinorrhea and sore throat.   Respiratory: Positive for cough.   Musculoskeletal: Positive for myalgias.  Neurological: Positive for headaches.  All other systems reviewed and are negative.  Physical Exam Updated Vital Signs BP 152/74 (BP Location: Right Arm)   Pulse 63   Temp 98.3 F (36.8 C) (Oral)   Resp 18   Ht 5' 4.5" (1.638 m)   Wt 158 lb (71.7 kg)   SpO2 99%   BMI 26.70 kg/m   Physical Exam  Constitutional: She is oriented to person, place, and time. She appears well-developed and well-nourished. No distress.  HENT:  Head: Normocephalic.  Nose: Nose normal.  Mouth/Throat: Uvula is midline. Posterior oropharyngeal erythema (mild) present. No oropharyngeal exudate, posterior oropharyngeal edema or tonsillar abscesses.  Eyes: Conjunctivae are normal.  Neck: Neck supple. No tracheal deviation present.  Cardiovascular: Regular rhythm, S1 normal, S2 normal and normal heart sounds.  Bradycardia present.   Pulmonary/Chest: Effort normal. No respiratory distress.  Abdominal: Soft. She  exhibits no distension.  Neurological: She is alert and oriented to person, place, and time.  Skin: Skin is warm and dry.  Psychiatric: She has a normal mood and affect.  Nursing note and vitals reviewed.  ED Treatments / Results  DIAGNOSTIC STUDIES: Oxygen Saturation is 99% on RA, normal by my  interpretation.   COORDINATION OF CARE: 7:50 PM-Discussed next steps with pt. Pt verbalized understanding and is agreeable with the plan.   Labs (all labs ordered are listed, but only abnormal results are displayed) Labs Reviewed  CBG MONITORING, ED - Abnormal; Notable for the following:       Result Value   Glucose-Capillary 210 (*)    All other components within normal limits   EKG  EKG Interpretation None      Radiology Dg Chest 2 View  Result Date: 04/11/2016 CLINICAL DATA:  61 year old female with cough for several weeks. Right upper chest pain. Initial encounter. EXAM: CHEST  2 VIEW COMPARISON:  06/26/2015 and earlier. FINDINGS: Stable cardiac size at the upper limits of normal. Other mediastinal contours are within normal limits. Visualized tracheal air column is within normal limits. Bilateral increased pulmonary interstitial markings appear chronic and stable. No pneumothorax or pleural effusion. No confluent pulmonary opacity. No acute osseous abnormality identified. Negative visible bowel gas pattern. 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 Stable mild cardiomegaly. No acute cardiopulmonary abnormality. Electronically Signed   By: Odessa FlemingH  Hall M.D.   On: 04/11/2016 17:36   Procedures Procedures   Medications Ordered in ED Medications - No data to display  Initial Impression / Assessment and Plan / ED Course  I have reviewed the triage vital signs and the nursing notes.  Pertinent labs & imaging results that were available during my care of the patient were reviewed by me and considered in my medical decision making (see chart for details).     61 y.o. female presents with typical viral URI sx. Outside window for tamiflu treatment. No pneumonia on XR. Supportive care measures recommended. Plan to follow up with PCP as needed and return precautions discussed for worsening or new concerning symptoms.   Final Clinical Impressions(s) / ED Diagnoses    Final diagnoses:  Viral URI with cough   New Prescriptions Discharge Medication List as of 04/11/2016  7:54 PM    START taking these medications   Details  dextromethorphan-guaiFENesin (MUCINEX DM) 30-600 MG 12hr tablet Take 1 tablet by mouth 2 (two) times daily., Starting Mon 04/11/2016, Until Thu 04/21/2016, Print    fexofenadine-pseudoephedrine (ALLEGRA-D) 60-120 MG 12 hr tablet Take 1 tablet by mouth every 12 (twelve) hours., Starting Mon 04/11/2016, Until Thu 04/21/2016, Print       I personally performed the services described in this documentation, which was scribed in my presence. The recorded information has been reviewed and is accurate.     Lyndal Pulleyaniel Amily Depp, MD 04/12/16 [phone]

## 2016-04-11 NOTE — ED Triage Notes (Signed)
Patient with c/o congestion, cough, stuffy nose/runny nose, body aches. Symptoms started Wednesday. Patient has taken theraflu, alkaseltzer plus, tylenol (last on Saturday). 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 Afebrile today.

## 2016-09-20 ENCOUNTER — Emergency Department (HOSPITAL_BASED_OUTPATIENT_CLINIC_OR_DEPARTMENT_OTHER)
Admission: EM | Admit: 2016-09-20 | Discharge: 2016-09-20 | Disposition: A | Discharge status: Home / Self Care | Payer: Worker's Compensation | Attending: Emergency Medicine | Admitting: Emergency Medicine

## 2016-09-20 ENCOUNTER — Emergency Department (HOSPITAL_BASED_OUTPATIENT_CLINIC_OR_DEPARTMENT_OTHER): Payer: Worker's Compensation

## 2016-09-20 ENCOUNTER — Encounter (HOSPITAL_BASED_OUTPATIENT_CLINIC_OR_DEPARTMENT_OTHER): Payer: Self-pay | Admitting: Emergency Medicine

## 2016-09-20 ENCOUNTER — Ambulatory Visit (INDEPENDENT_AMBULATORY_CARE_PROVIDER_SITE_OTHER): Payer: Worker's Compensation | Admitting: Orthopaedic Surgery

## 2016-09-20 VITALS — BP 149/68 | HR 64

## 2016-09-20 DIAGNOSIS — I10 Essential (primary) hypertension: Secondary | ICD-10-CM | POA: Diagnosis not present

## 2016-09-20 DIAGNOSIS — E119 Type 2 diabetes mellitus without complications: Secondary | ICD-10-CM | POA: Diagnosis not present

## 2016-09-20 DIAGNOSIS — Y929 Unspecified place or not applicable: Secondary | ICD-10-CM | POA: Insufficient documentation

## 2016-09-20 DIAGNOSIS — Y9301 Activity, walking, marching and hiking: Secondary | ICD-10-CM | POA: Diagnosis not present

## 2016-09-20 DIAGNOSIS — M25562 Pain in left knee: Secondary | ICD-10-CM | POA: Diagnosis not present

## 2016-09-20 DIAGNOSIS — Z7984 Long term (current) use of oral hypoglycemic drugs: Secondary | ICD-10-CM | POA: Diagnosis not present

## 2016-09-20 DIAGNOSIS — Z91040 Latex allergy status: Secondary | ICD-10-CM | POA: Insufficient documentation

## 2016-09-20 DIAGNOSIS — Y990 Civilian activity done for income or pay: Secondary | ICD-10-CM | POA: Diagnosis not present

## 2016-09-20 DIAGNOSIS — S82035A Nondisplaced transverse fracture of left patella, initial encounter for closed fracture: Secondary | ICD-10-CM | POA: Diagnosis not present

## 2016-09-20 DIAGNOSIS — W010XXA Fall on same level from slipping, tripping and stumbling without subsequent striking against object, initial encounter: Secondary | ICD-10-CM | POA: Insufficient documentation

## 2016-09-20 DIAGNOSIS — Z79899 Other long term (current) drug therapy: Secondary | ICD-10-CM | POA: Insufficient documentation

## 2016-09-20 DIAGNOSIS — S8992XA Unspecified injury of left lower leg, initial encounter: Secondary | ICD-10-CM | POA: Diagnosis present

## 2016-09-20 MED ORDER — OXYCODONE-ACETAMINOPHEN 5-325 MG PO TABS: 1.0000 | ORAL_TABLET | Freq: Four times a day (QID) | ORAL | 0 refills | Status: DC | PRN

## 2016-09-20 MED ORDER — OXYCODONE-ACETAMINOPHEN 5-325 MG PO TABS
1.0000 | ORAL_TABLET | Freq: Once | ORAL | Status: AC
Start: 2016-09-20 — End: 2016-09-20
  Administered 2016-09-20: 1 via ORAL
  Filled 2016-09-20: qty 1

## 2016-09-20 NOTE — Discharge Instructions (Signed)
You were seen today and have a fracture of your patella. Maintain the mean knee immobilizer and do not bear weight. He will be given crutches. Apply ice and elevate the knee above your heart to help with swelling. Follow-up with orthopedics in the next 1-3 days for repeat evaluation.

## 2016-09-20 NOTE — ED Triage Notes (Signed)
Pt tripped over wheelchair pedal at work and fell hitting L knee. Rates pain 8/10.

## 2016-09-20 NOTE — Progress Notes (Signed)
Office Visit Note   Patient: Sharon Leonard           Date of Birth: 1955/10/14           MRN: [id number] Visit Date: 09/20/2016              Requested by: Dorris Singh, DO [address] [facility], [place] [address] PCP: Dorris Singh, DO   Assessment & Plan: Visit Diagnoses:  1. 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 She is in satisfactory position as long as she keeps the knee immobilizer on and avoid falling should not displace the fracture should heal. I will recheck her in 2 weeks if she begins displaced fracture she required operative stabilization of the fracture. Currently she has no distraction of the fracture and should heal successfully with conservative treatment. We discussed importance of elevation to decrease swelling and decrease her activity level to avoid increase in her hemarthrosis. Recheck 2 weeks with AP and lateral x-ray with the knee extended. No flexion of the knee when she obtains x-rays.  Follow-Up Instructions: Return in about 2 weeks (around 10/04/2016).   Orders:  No orders of the defined types were placed in this encounter.  No orders of the defined types were placed in this encounter.     Procedures: No procedures performed   Clinical Data: No additional findings.   Subjective: Chief Complaint  Patient presents with  . Left Knee - Pain, Fracture    HPI 61-year-old female with diabetes hypertension tripped at work over a wheelchair landing on her left knee flexed position with transverse patellar fracture. She was placed in a very short knee immobilizer which does not keep her knee extended. She has crutches. No past history of knee injury.  Review of Systems review of systems is positive for diabetes  also hypertension previous hysterectomy. Glucose 1 year ago was 126. Patient takes Janumet.   Objective: Vital Signs: BP (!) 149/68   Pulse 64   Physical Exam  Constitutional: She is oriented to person, place, and time. She appears well-developed.  HENT:  Head: Normocephalic.  Right Ear: External ear normal.  Left Ear: External ear normal.  Eyes: Pupils are equal, round, and reactive to light.  Neck: No tracheal deviation present. No thyromegaly present.  Cardiovascular: Normal rate.   Pulmonary/Chest: Effort normal.  Abdominal: Soft.  Musculoskeletal:  No pain with hip range of motion. Skin over the left knee is normal. She has mild hemarthrosis. Tenderness over the mid pole of the patella. Distal pulses are intact. No rash over exposed skin.  Neurological: She is alert and oriented to person, place, and time.  Skin: Skin is warm and dry.  Psychiatric: She has a normal mood and affect. Her behavior is normal.    Ortho Exam  Specialty Comments:  No specialty comments available.  Imaging: Dg Knee Complete 4 Views Left  Result Date: 09/20/2016 CLINICAL DATA:  Anterior knee pain after falling 1 hour ago. EXAM: LEFT KNEE - COMPLETE 4+ VIEW COMPARISON:  None. FINDINGS: There is a transverse fracture through the midportion of the patella. No significant distraction of the fracture fragments. No other acute fracture. No dislocation. IMPRESSION: Patellar fracture Electronically Signed   By: Ellery Plunk  M.D.   On: 09/20/2016 04:42     PMFS History: There are no active problems to display for this patient.  Past Medical History:  Diagnosis Date  . Diabetes mellitus without complication (HCC)   . Hypertension     No family history on file.  Past Surgical History:  Procedure Laterality Date  . ABDOMINAL HYSTERECTOMY     Social History   Occupational History  . Not on file.   Social History Main Topics  . Smoking status: Never Smoker  . Smokeless tobacco: Never Used    . Alcohol use No  . Drug use: No  . Sexual activity: Not on file

## 2016-09-20 NOTE — ED Provider Notes (Signed)
[facility] Provider Note   CSN: [id number] Arrival date & time: 09/20/16  0402     History   Chief Complaint Chief Complaint  Patient presents with  . Fall    HPI Sharon Leonard is a 61 y.o. female.  HPI  This is a 61 year old female with a history of diabetes and hypertension who presents with left knee pain. She reports that she was at work when she tripped over a wheelchair and landed on her left knee. She denies hitting her head or loss of consciousness. She denies other injury. She reports 10 out of 10 left knee pain. She has not been ambulatory. Denies numbness or tingling of the left lower extremity.  Past Medical History:  Diagnosis Date  . Diabetes mellitus without complication (HCC)   . Hypertension     There are no active problems to display for this patient.   Past Surgical History:  Procedure Laterality Date  . ABDOMINAL HYSTERECTOMY      OB History    No data available       Home Medications    Prior to Admission medications   Medication Sig Start Date End Date Taking? Authorizing Provider  cetirizine (ZYRTEC) 10 MG tablet Take 10 mg by mouth daily.    [provider]  lisinopril (PRINIVIL,ZESTRIL) 5 MG tablet Take 5 mg by mouth daily.    [provider]  oxyCODONE-acetaminophen (PERCOCET/ROXICET) 5-325 MG tablet Take 1 tablet by mouth every 6 (six) hours as needed for severe pain. 09/20/16   Jeanee Fabre, Mayer Maskerourtney F, MD  pravastatin (PRAVACHOL) 40 MG tablet Take 40 mg by mouth daily.    [provider]  SitaGLIPtin-MetFORMIN HCl (JANUMET PO) Take by mouth.    [provider]    Family History No family history on file.  Social History Social History  Substance Use Topics  . Smoking status: Never Smoker  . Smokeless tobacco: Never Used  . Alcohol use No     Allergies   Raspberry; Shellfish allergy; Sulfa antibiotics; and Latex   Review of Systems Review of Systems  Musculoskeletal:  Positive for joint swelling.       Left knee pain  Neurological: Negative for weakness and numbness.  All other systems reviewed and are negative.    Physical Exam Updated Vital Signs Ht 5\' 4"  (1.626 m)   Wt 71.2 kg (157 lb)   BMI 26.95 kg/m   Physical Exam  Constitutional: She is oriented to person, place, and time. She appears well-developed and well-nourished.  HENT:  Head: Normocephalic and atraumatic.  Cardiovascular: Normal rate, regular rhythm and normal heart sounds.   Pulmonary/Chest: Effort normal. No respiratory distress.  Musculoskeletal:  Mild swelling noted to the left knee, deformity with indentation noted over the patella, with the knee in a flexed position, patient can extend to approximately 150 but is short of full extension, she is unable to straight leg raise off the bed with the leg in full extension, 2+ DP pulse, neurovascularly intact distally  Neurological: She is alert and oriented to person, place, and time.  Skin: Skin is warm and dry.  Psychiatric: She has a normal mood and affect.  Nursing note and vitals reviewed.    ED Treatments / Results  Labs (all labs ordered are listed, but only abnormal results are displayed) Labs Reviewed - No data to display  EKG  EKG Interpretation None       Radiology Dg Knee Complete 4 Views Left  Result Date: 09/20/2016  CLINICAL DATA:  Anterior knee pain after falling 1 hour ago. EXAM: LEFT KNEE - COMPLETE 4+ VIEW COMPARISON:  None. FINDINGS: There is a transverse fracture through the midportion of the patella. No significant distraction of the fracture fragments. No other acute fracture. No dislocation. IMPRESSION: Patellar fracture Electronically Signed   By: Ellery Plunk M.D.   On: 09/20/2016 04:42    Procedures Procedures (including critical care time)  Medications Ordered in ED Medications  oxyCODONE-acetaminophen (PERCOCET/ROXICET) 5-325 MG per tablet 1 tablet (1 tablet Oral Given 09/20/16  0458)     Initial Impression / Assessment and Plan / ED Course  I have reviewed the triage vital signs and the nursing notes.  Pertinent labs & imaging results that were available during my care of the patient were reviewed by me and considered in my medical decision making (see chart for details).     Patient presents with left knee pain. Reports direct impact to the left knee. Exam suspicious for patellar fracture. X-rays confirm. Question whether or not the extensor Jolyn Nap is some may have been disrupted; however, patient also is a fair bit of pain. Patient placed in a knee immobilizer and given pain medication. Discussed with Dr. Ophelia Charter. He will reevaluate in the next 2-3 days. Patient was advised to apply ice and keep leg elevated. She should remain nonweightbearing until reevaluation.  After history, exam, and medical workup I feel the patient has been appropriately medically screened and is safe for discharge home. Pertinent diagnoses were discussed with the patient. Patient was given return precautions.   Final Clinical Impressions(s) / ED Diagnoses   Final diagnoses:  Closed nondisplaced transverse fracture of left patella, initial encounter    New Prescriptions New Prescriptions   OXYCODONE-ACETAMINOPHEN (PERCOCET/ROXICET) 5-325 MG TABLET    Take 1 tablet by mouth every 6 (six) hours as needed for severe pain.     Shon Baton, MD 09/20/16 [phone]

## 2016-09-21 ENCOUNTER — Telehealth (INDEPENDENT_AMBULATORY_CARE_PROVIDER_SITE_OTHER): Payer: Self-pay | Admitting: Radiology

## 2016-09-21 NOTE — Telephone Encounter (Signed)
Please advise 

## 2016-09-21 NOTE — Telephone Encounter (Signed)
Patient has called and asked for a different pain medication than the percocet.  She is itching badly.  I advised her to use benadryl as directed for the itching, please call her to advise on Rx.

## 2016-09-22 NOTE — Telephone Encounter (Signed)
I  Called doing  Better. Leg elevated. Benadryl worked.

## 2016-09-26 ENCOUNTER — Telehealth (INDEPENDENT_AMBULATORY_CARE_PROVIDER_SITE_OTHER): Payer: Self-pay | Admitting: Orthopaedic Surgery

## 2016-09-26 MED ORDER — TRAMADOL HCL 50 MG PO TABS: 50.0000 mg | ORAL_TABLET | Freq: Four times a day (QID) | ORAL | 0 refills | Status: DC | PRN

## 2016-09-26 NOTE — Telephone Encounter (Signed)
Patient called saying that she has been taking the percocet with the benadryl and she's still really itchy. She was wondering if there was something else she could take for the pain w/o sulfur in it. CB # [phone]

## 2016-09-26 NOTE — Addendum Note (Signed)
Addended by: Rogers SeedsYEATTS, Arwen Haseley M on: 09/26/2016 03:24 PM   Modules accepted: Orders

## 2016-09-26 NOTE — Telephone Encounter (Signed)
Called to [facility] [address] per patient request. Patient advised.

## 2016-09-26 NOTE — Telephone Encounter (Signed)
Send in ultram  # 40  1 po q 6 hrs prn pain

## 2016-09-26 NOTE — Telephone Encounter (Signed)
Please advise 

## 2016-10-05 ENCOUNTER — Ambulatory Visit (INDEPENDENT_AMBULATORY_CARE_PROVIDER_SITE_OTHER): Payer: Worker's Compensation

## 2016-10-05 ENCOUNTER — Ambulatory Visit (INDEPENDENT_AMBULATORY_CARE_PROVIDER_SITE_OTHER): Payer: Worker's Compensation | Admitting: Orthopaedic Surgery

## 2016-10-05 ENCOUNTER — Telehealth (INDEPENDENT_AMBULATORY_CARE_PROVIDER_SITE_OTHER): Payer: Self-pay

## 2016-10-05 ENCOUNTER — Encounter (INDEPENDENT_AMBULATORY_CARE_PROVIDER_SITE_OTHER): Payer: Self-pay | Admitting: Orthopaedic Surgery

## 2016-10-05 DIAGNOSIS — M25562 Pain in left knee: Secondary | ICD-10-CM | POA: Diagnosis not present

## 2016-10-05 MED ORDER — METHOCARBAMOL 500 MG PO TABS: 500.0000 mg | ORAL_TABLET | Freq: Four times a day (QID) | ORAL | 0 refills | Status: DC | PRN

## 2016-10-05 NOTE — Progress Notes (Addendum)
   Office Visit Note   Patient: Sharon SoxVictoria Nolt           Date of Birth: 03-10-1955           MRN: [id number] Visit Date: 10/05/2016              Requested by: Dorris SinghBradley, Candace, DO [address] [facility], [place] [id number] PCP: Dorris SinghBradley, Candace, DO   Assessment & Plan: Visit Diagnoses:  1. Acute pain of left knee   Left patella fracture Status post work-related injury  Plan: We recommend doing an open reduction internal fixation procedure her left patella fracture. X-rays show that the fracture has further displacement from previous films. Surgical procedure along with potential rehabilitation/recovery time discussed in detail. All questions answered. Prescription given for wheelchair with leg rest.  Patient was given a work note keeping her out until further notice.  Follow-Up Instructions: Return in about 4 weeks (around 11/02/2016).   Orders:  Orders Placed This Encounter  Procedures  . XR Knee 1-2 Views Left   No orders of the defined types were placed in this encounter.     Procedures: No procedures performed   Clinical Data: No additional findings.   Subjective: No chief complaint on file.   HPI 61 year old black female returns for recheck of left patella fracture. She continues to have obvious discomfort and difficulty sleeping due to pain. She was unable to take Percocet due to itching.  Currently using tramadol. Patient requesting wheelchair leg raise. Review of Systems No current cardiac pulmonary GI GU issues  Objective: Vital Signs: There were no vitals taken for this visit.  Physical Exam  Ortho Exam  Specialty Comments:  No specialty comments available.  Imaging: Xr Knee 1-2 Views Left  Result Date: 10/05/2016 X-ray left knee does show a couple of millimeters of displacement compared to previous x-ray 09/20/2016.    PMFS History: There are no active problems to display for this patient.  Past Medical  History:  Diagnosis Date  . Diabetes mellitus without complication (HCC)   . Hypertension     No family history on file.  Past Surgical History:  Procedure Laterality Date  . ABDOMINAL HYSTERECTOMY     Social History   Occupational History  . Not on file.   Social History Main Topics  . Smoking status: Never Smoker  . Smokeless tobacco: Never Used  . Alcohol use No  . Drug use: No  . Sexual activity: Not on file

## 2016-10-05 NOTE — Telephone Encounter (Signed)
Emailed the 09/20/16 office note to the case mgr per her request

## 2016-10-05 NOTE — Patient Instructions (Signed)
Must continue wearing knee immobilizer.  Elevate foot to decrease swelling.  Do not bend knee  Nonweightbearing left lower extremity  Recommend keeping her blood sugars under control.

## 2016-10-10 ENCOUNTER — Telehealth (INDEPENDENT_AMBULATORY_CARE_PROVIDER_SITE_OTHER): Payer: Self-pay | Admitting: Orthopaedic Surgery

## 2016-10-10 NOTE — Telephone Encounter (Signed)
[facility] called saying that they will not be able to provide the wheel chair because of workers comp.

## 2016-10-11 ENCOUNTER — Telehealth (INDEPENDENT_AMBULATORY_CARE_PROVIDER_SITE_OTHER): Payer: Self-pay

## 2016-10-11 NOTE — Telephone Encounter (Signed)
Ok thanks 

## 2016-10-11 NOTE — Telephone Encounter (Signed)
Received email from case mgr stating pt is requesting a tub chair or bench. She will need rx for this sent to her if Dr. Ophelia CharterYates agree.

## 2016-10-11 NOTE — Telephone Encounter (Signed)
[facility]

## 2016-10-11 NOTE — Telephone Encounter (Signed)
Ok for order?  

## 2016-10-11 NOTE — Telephone Encounter (Signed)
Ok thanks, with elevated leg rest for patella fx side

## 2016-10-13 ENCOUNTER — Telehealth (INDEPENDENT_AMBULATORY_CARE_PROVIDER_SITE_OTHER): Payer: Self-pay

## 2016-10-13 ENCOUNTER — Telehealth (INDEPENDENT_AMBULATORY_CARE_PROVIDER_SITE_OTHER): Payer: Self-pay | Admitting: Orthopaedic Surgery

## 2016-10-13 NOTE — Telephone Encounter (Signed)
Ok to change strength?

## 2016-10-13 NOTE — Telephone Encounter (Signed)
Patient called asking for a wheel chair placard. CB # [phone]

## 2016-10-13 NOTE — Telephone Encounter (Signed)
Lorain ChildesFyi. [facility] I also faxed her a script for a wheelchair that he had written for her and wanted her to have. Per another message in the chart, [facility] but they could not provide it for patient due to it being WC.

## 2016-10-13 NOTE — Telephone Encounter (Signed)
[facility]

## 2016-10-13 NOTE — Telephone Encounter (Signed)
Sharon Leonard with [facility] would like to know if  Rx for Robaxin could be changed from 500mg  to 750mg  due to 500mg  of Robaxin being on back order.  Cb# is [phone].  Please advise.

## 2016-10-13 NOTE — Telephone Encounter (Signed)
Ok thanks 

## 2016-10-13 NOTE — Telephone Encounter (Signed)
I called and spoke with pharmacist.  She is going to fill Robaxin 750mg  1 po q 8 hours.

## 2016-10-14 NOTE — Telephone Encounter (Signed)
I spoke with patient. She is requesting handicap placard.

## 2016-10-14 NOTE — Telephone Encounter (Signed)
At front for pick up. Patient's sister will come to pick up.

## 2016-10-18 ENCOUNTER — Other Ambulatory Visit (INDEPENDENT_AMBULATORY_CARE_PROVIDER_SITE_OTHER): Payer: Self-pay | Admitting: Orthopaedic Surgery

## 2016-10-18 DIAGNOSIS — S82032G Displaced transverse fracture of left patella, subsequent encounter for closed fracture with delayed healing: Secondary | ICD-10-CM

## 2016-10-19 NOTE — Pre-Procedure Instructions (Signed)
Sharon Leonard  10/19/2016      [id number] - [place], [place] - [address] AT [address]OSS [address] [place] [address] Phone: [phone] Fax: [phone]    Your procedure is scheduled on October 24, 2016.  Report to [facility] Admitting at 1245 PM.  Call this number if you have problems the morning of surgery:  [phone]   Remember:  Do not eat food or drink liquids after midnight.  Take these medicines the morning of surgery with A SIP OF WATER cetirizine (zyrtec), methocarbamol (robaxin)- if needed for muscle spasms, oxycodone-acetaminophen (percocet), tobramycin eye drops, tramadol (ultram) -if needed for pain.  7 days prior to surgery STOP taking any Aspirin, Aleve, Naproxen, Ibuprofen, Motrin, Advil, Goody's, BC's, all herbal medications, fish oil, and all vitamins     How to Manage Your Diabetes Before and After Surgery  Why is it important to control my blood sugar before and after surgery? . Improving blood sugar levels before and after surgery helps healing and can limit problems. . A way of improving blood sugar control is eating a healthy diet by: o  Eating less sugar and carbohydrates o  Increasing activity/exercise o  Talking with your doctor about reaching your blood sugar goals . High blood sugars (greater than 180 mg/dL) can raise your risk of infections and slow your recovery, so you will need to focus on controlling your diabetes during the weeks before surgery. . Make sure that the doctor who takes care of your diabetes knows about your planned surgery including the date and location.  How do I manage my blood sugar before surgery? . Check your blood sugar at least 4 times a day, starting 2 days before surgery, to make sure that the level is not too high or low. o Check your blood sugar the morning of your surgery when you wake up and every 2 hours until you get to the Short Stay unit. . If  your blood sugar is less than 70 mg/dL, you will need to treat for low blood sugar: o Do not take insulin. o Treat a low blood sugar (less than 70 mg/dL) with  cup of clear juice (cranberry or apple), 4 glucose tablets, OR glucose gel. o Recheck blood sugar in 15 minutes after treatment (to make sure it is greater than 70 mg/dL). If your blood sugar is not greater than 70 mg/dL on recheck, call [phone] for further instructions. . Report your blood sugar to the short stay nurse when you get to Short Stay.  . If you are admitted to the hospital after surgery: o Your blood sugar will be checked by the staff and you will probably be given insulin after surgery (instead of oral diabetes medicines) to make sure you have good blood sugar levels. o The goal for blood sugar control after surgery is 80-180 mg/dL.      WHAT DO I DO ABOUT MY DIABETES MEDICATION?   Marland Kitchen Do not take oral diabetes medicines (pills) the morning of surgery.   . The day of surgery, do not take other diabetes injectables, including Byetta (exenatide), Bydureon (exenatide ER), Victoza (liraglutide), or Trulicity (dulaglutide).  . If your CBG is greater than 220 mg/dL, you may take  of your sliding scale (correction) dose of insulin.   Reviewed and Endorsed by [facility] Patient Education Committee, August 2015   Do not wear jewelry, make-up or nail polish.  Do not wear  lotions, powders, or perfumes, or deoderant.  Do not shave 48 hours prior to surgery.    Do not bring valuables to the hospital.  [facility] is not responsible for any belongings or valuables.  Contacts, dentures or bridgework may not be worn into surgery.  Leave your suitcase in the car.  After surgery it may be brought to your room.  For patients admitted to the hospital, discharge time will be determined by your treatment team.  Patients discharged the day of surgery will not be allowed to drive home.    Special instructions:   [facility]- Preparing For Surgery  Before surgery, you can play an important role. Because skin is not sterile, your skin needs to be as free of germs as possible. You can reduce the number of germs on your skin by washing with CHG (chlorahexidine gluconate) Soap before surgery.  CHG is an antiseptic cleaner which kills germs and bonds with the skin to continue killing germs even after washing.  Please do not use if you have an allergy to CHG or antibacterial soaps. If your skin becomes reddened/irritated stop using the CHG.  Do not shave (including legs and underarms) for at least 48 hours prior to first CHG shower. It is OK to shave your face.  Please follow these instructions carefully.   1. Shower the NIGHT BEFORE SURGERY and the MORNING OF SURGERY with CHG.   2. If you chose to wash your hair, wash your hair first as usual with your normal shampoo.  3. After you shampoo, rinse your hair and body thoroughly to remove the shampoo.  4. Use CHG as you would any other liquid soap. You can apply CHG directly to the skin and wash gently with a scrungie or a clean washcloth.   5. Apply the CHG Soap to your body ONLY FROM THE NECK DOWN.  Do not use on open wounds or open sores. Avoid contact with your eyes, ears, mouth and genitals (private parts). Wash genitals (private parts) with your normal soap.  6. Wash thoroughly, paying special attention to the area where your surgery will be performed.  7. Thoroughly rinse your body with warm water from the neck down.  8. DO NOT shower/wash with your normal soap after using and rinsing off the CHG Soap.  9. Pat yourself dry with a CLEAN TOWEL.   10. Wear CLEAN PAJAMAS   11. Place CLEAN SHEETS on your bed the night of your first shower and DO NOT SLEEP WITH PETS.    Day of Surgery: Do not apply any deodorants/lotions. Please wear clean clothes to the hospital/surgery center.     Please read over the following fact sheets that you were  given. Pain Booklet, Coughing and Deep Breathing and Surgical Site Infection Prevention

## 2016-10-20 ENCOUNTER — Encounter (HOSPITAL_COMMUNITY)
Admission: RE | Admit: 2016-10-20 | Discharge: 2016-10-20 | Disposition: A | Discharge status: Home / Self Care | Payer: Worker's Compensation | Source: Ambulatory Visit | Attending: Orthopaedic Surgery | Admitting: Orthopaedic Surgery

## 2016-10-20 ENCOUNTER — Encounter (HOSPITAL_COMMUNITY): Payer: Self-pay

## 2016-10-20 DIAGNOSIS — Z01818 Encounter for other preprocedural examination: Secondary | ICD-10-CM | POA: Insufficient documentation

## 2016-10-20 DIAGNOSIS — X58XXXA Exposure to other specified factors, initial encounter: Secondary | ICD-10-CM | POA: Insufficient documentation

## 2016-10-20 DIAGNOSIS — S82002A Unspecified fracture of left patella, initial encounter for closed fracture: Secondary | ICD-10-CM | POA: Diagnosis not present

## 2016-10-20 DIAGNOSIS — Z01812 Encounter for preprocedural laboratory examination: Secondary | ICD-10-CM | POA: Insufficient documentation

## 2016-10-20 HISTORY — DX: Unspecified osteoarthritis, unspecified site: M19.90

## 2016-10-20 HISTORY — DX: Personal history of urinary calculi: Z87.442

## 2016-10-20 HISTORY — DX: Personal history of other diseases of the digestive system: Z87.19

## 2016-10-20 LAB — URINALYSIS, ROUTINE W REFLEX MICROSCOPIC
Bilirubin Urine: NEGATIVE
GLUCOSE, UA: NEGATIVE mg/dL
HGB URINE DIPSTICK: NEGATIVE
KETONES UR: NEGATIVE mg/dL
LEUKOCYTES UA: NEGATIVE
Nitrite: NEGATIVE
PH: 5 (ref 5.0–8.0)
Protein, ur: NEGATIVE mg/dL
Specific Gravity, Urine: 1.024 (ref 1.005–1.030)

## 2016-10-20 LAB — CBC
HEMATOCRIT: 38.7 % (ref 36.0–46.0)
HEMOGLOBIN: 13.5 g/dL (ref 12.0–15.0)
MCH: 28.1 pg (ref 26.0–34.0)
MCHC: 34.9 g/dL (ref 30.0–36.0)
MCV: 80.5 fL (ref 78.0–100.0)
Platelets: 301 10*3/uL (ref 150–400)
RBC: 4.81 MIL/uL (ref 3.87–5.11)
RDW: 12.7 % (ref 11.5–15.5)
WBC: 6 10*3/uL (ref 4.0–10.5)

## 2016-10-20 LAB — HEMOGLOBIN A1C
Hgb A1c MFr Bld: 7.4 % — ABNORMAL HIGH (ref 4.8–5.6)
Mean Plasma Glucose: 165.68 mg/dL

## 2016-10-20 LAB — COMPREHENSIVE METABOLIC PANEL
ALBUMIN: 4 g/dL (ref 3.5–5.0)
ALK PHOS: 78 U/L (ref 38–126)
ALT: 28 U/L (ref 14–54)
ANION GAP: 10 (ref 5–15)
AST: 32 U/L (ref 15–41)
BILIRUBIN TOTAL: 0.8 mg/dL (ref 0.3–1.2)
BUN: 10 mg/dL (ref 6–20)
CALCIUM: 9.5 mg/dL (ref 8.9–10.3)
CO2: 22 mmol/L (ref 22–32)
Chloride: 105 mmol/L (ref 101–111)
Creatinine, Ser: 0.81 mg/dL (ref 0.44–1.00)
GFR calc Af Amer: >60 mL/min (ref >60)
GFR calc non Af Amer: >60 mL/min (ref >60)
GLUCOSE: 185 mg/dL — AB (ref 65–99)
Potassium: 4.5 mmol/L (ref 3.5–5.1)
SODIUM: 137 mmol/L (ref 135–145)
TOTAL PROTEIN: 7.8 g/dL (ref 6.5–8.1)

## 2016-10-20 LAB — GLUCOSE, CAPILLARY: Glucose-Capillary: 173 mg/dL — ABNORMAL HIGH (ref 65–99)

## 2016-10-20 NOTE — Progress Notes (Signed)
Pt. Reports that she uses eye Rx for inflammation of eyes. Pt. Reports that she had a ECHO due to chest pain- 2018, for which she reports that it was normal. Pt. Denies all chest concerns today. PCP- [facility]- Dr. Katherine Mantle. Requested records from her office & rec'd & placed on chart.

## 2016-10-21 ENCOUNTER — Other Ambulatory Visit (HOSPITAL_COMMUNITY): Payer: Self-pay

## 2016-10-24 ENCOUNTER — Observation Stay (HOSPITAL_COMMUNITY)
Admission: RE | Admit: 2016-10-24 | Discharge: 2016-10-25 | Disposition: A | Discharge status: Home Health | Payer: Worker's Compensation | Source: Ambulatory Visit | Attending: Orthopaedic Surgery | Admitting: Orthopaedic Surgery

## 2016-10-24 ENCOUNTER — Encounter (HOSPITAL_COMMUNITY): Payer: Self-pay | Admitting: *Deleted

## 2016-10-24 ENCOUNTER — Ambulatory Visit (HOSPITAL_COMMUNITY): Payer: Worker's Compensation | Admitting: Anesthesiology

## 2016-10-24 ENCOUNTER — Ambulatory Visit (HOSPITAL_COMMUNITY): Payer: Worker's Compensation

## 2016-10-24 ENCOUNTER — Encounter (HOSPITAL_COMMUNITY)
Admission: RE | Disposition: A | Discharge status: Home Health | Payer: Self-pay | Source: Ambulatory Visit | Attending: Orthopaedic Surgery

## 2016-10-24 DIAGNOSIS — S82002A Unspecified fracture of left patella, initial encounter for closed fracture: Secondary | ICD-10-CM | POA: Diagnosis not present

## 2016-10-24 DIAGNOSIS — Y9289 Other specified places as the place of occurrence of the external cause: Secondary | ICD-10-CM | POA: Diagnosis not present

## 2016-10-24 DIAGNOSIS — Z882 Allergy status to sulfonamides status: Secondary | ICD-10-CM | POA: Insufficient documentation

## 2016-10-24 DIAGNOSIS — Z79899 Other long term (current) drug therapy: Secondary | ICD-10-CM | POA: Diagnosis not present

## 2016-10-24 DIAGNOSIS — S82032A Displaced transverse fracture of left patella, initial encounter for closed fracture: Secondary | ICD-10-CM | POA: Diagnosis not present

## 2016-10-24 DIAGNOSIS — Z79891 Long term (current) use of opiate analgesic: Secondary | ICD-10-CM | POA: Insufficient documentation

## 2016-10-24 DIAGNOSIS — E119 Type 2 diabetes mellitus without complications: Secondary | ICD-10-CM | POA: Diagnosis not present

## 2016-10-24 DIAGNOSIS — X58XXXA Exposure to other specified factors, initial encounter: Secondary | ICD-10-CM | POA: Insufficient documentation

## 2016-10-24 DIAGNOSIS — Z419 Encounter for procedure for purposes other than remedying health state, unspecified: Secondary | ICD-10-CM

## 2016-10-24 DIAGNOSIS — I10 Essential (primary) hypertension: Secondary | ICD-10-CM | POA: Diagnosis not present

## 2016-10-24 DIAGNOSIS — S82032G Displaced transverse fracture of left patella, subsequent encounter for closed fracture with delayed healing: Secondary | ICD-10-CM

## 2016-10-24 DIAGNOSIS — Z791 Long term (current) use of non-steroidal anti-inflammatories (NSAID): Secondary | ICD-10-CM | POA: Diagnosis not present

## 2016-10-24 HISTORY — PX: ORIF PATELLA: SHX5033

## 2016-10-24 LAB — GLUCOSE, CAPILLARY
GLUCOSE-CAPILLARY: 164 mg/dL — AB (ref 65–99)
Glucose-Capillary: 134 mg/dL — ABNORMAL HIGH (ref 65–99)
Glucose-Capillary: 150 mg/dL — ABNORMAL HIGH (ref 65–99)

## 2016-10-24 SURGERY — OPEN REDUCTION INTERNAL FIXATION (ORIF) PATELLA
Anesthesia: General | Site: Knee | Laterality: Left

## 2016-10-24 MED ORDER — METOCLOPRAMIDE HCL 5 MG/ML IJ SOLN: 5.0000 mg | Freq: Three times a day (TID) | INTRAMUSCULAR | Status: DC | PRN

## 2016-10-24 MED ORDER — EPHEDRINE SULFATE 50 MG/ML IJ SOLN
INTRAMUSCULAR | Status: DC | PRN
  Administered 2016-10-24: 10 mg via INTRAVENOUS

## 2016-10-24 MED ORDER — CEFAZOLIN SODIUM-DEXTROSE 1-4 GM/50ML-% IV SOLN
1.0000 g | Freq: Three times a day (TID) | INTRAVENOUS | Status: AC
  Administered 2016-10-24: 1 g via INTRAVENOUS
  Filled 2016-10-24 (×2): qty 50

## 2016-10-24 MED ORDER — MIDAZOLAM HCL 2 MG/2ML IJ SOLN
INTRAMUSCULAR | Status: AC
  Filled 2016-10-24: qty 2

## 2016-10-24 MED ORDER — OXYCODONE HCL 5 MG PO TABS: 5.0000 mg | ORAL_TABLET | Freq: Once | ORAL | Status: DC | PRN

## 2016-10-24 MED ORDER — FENTANYL CITRATE (PF) 100 MCG/2ML IJ SOLN
INTRAMUSCULAR | Status: AC
  Filled 2016-10-24: qty 2

## 2016-10-24 MED ORDER — DIPHENHYDRAMINE HCL 50 MG/ML IJ SOLN
25.0000 mg | Freq: Four times a day (QID) | INTRAMUSCULAR | Status: DC | PRN
  Administered 2016-10-25: 25 mg via INTRAVENOUS
  Filled 2016-10-24 (×2): qty 0.5

## 2016-10-24 MED ORDER — ACETAMINOPHEN 325 MG PO TABS: 650.0000 mg | ORAL_TABLET | Freq: Four times a day (QID) | ORAL | Status: DC | PRN

## 2016-10-24 MED ORDER — PROMETHAZINE HCL 25 MG/ML IJ SOLN
6.2500 mg | INTRAMUSCULAR | Status: DC | PRN
  Administered 2016-10-24: 6.25 mg via INTRAVENOUS

## 2016-10-24 MED ORDER — ASPIRIN 325 MG PO TABS
325.0000 mg | ORAL_TABLET | Freq: Every day | ORAL | Status: DC
  Administered 2016-10-25: 325 mg via ORAL
  Filled 2016-10-24: qty 1

## 2016-10-24 MED ORDER — LACTATED RINGERS IV SOLN
INTRAVENOUS | Status: DC
  Administered 2016-10-24: 13:00:00 via INTRAVENOUS

## 2016-10-24 MED ORDER — INSULIN ASPART 100 UNIT/ML ~~LOC~~ SOLN
0.0000 [IU] | Freq: Three times a day (TID) | SUBCUTANEOUS | Status: DC
  Administered 2016-10-25: 2 [IU] via SUBCUTANEOUS

## 2016-10-24 MED ORDER — OXYCODONE HCL 5 MG/5ML PO SOLN: 5.0000 mg | Freq: Once | ORAL | Status: DC | PRN

## 2016-10-24 MED ORDER — PROMETHAZINE HCL 25 MG/ML IJ SOLN
INTRAMUSCULAR | Status: AC
  Administered 2016-10-24: 6.25 mg via INTRAVENOUS
  Filled 2016-10-24: qty 1

## 2016-10-24 MED ORDER — FENTANYL CITRATE (PF) 100 MCG/2ML IJ SOLN
100.0000 ug | Freq: Once | INTRAMUSCULAR | Status: AC
  Administered 2016-10-24: 100 ug via INTRAVENOUS
  Filled 2016-10-24: qty 2

## 2016-10-24 MED ORDER — ONDANSETRON HCL 4 MG/2ML IJ SOLN
4.0000 mg | Freq: Four times a day (QID) | INTRAMUSCULAR | Status: DC | PRN
  Administered 2016-10-24: 4 mg via INTRAVENOUS
  Filled 2016-10-24: qty 2

## 2016-10-24 MED ORDER — 0.9 % SODIUM CHLORIDE (POUR BTL) OPTIME
TOPICAL | Status: DC | PRN
  Administered 2016-10-24: 1000 mL

## 2016-10-24 MED ORDER — HYDROMORPHONE HCL 1 MG/ML IJ SOLN
0.5000 mg | INTRAMUSCULAR | Status: DC | PRN
  Administered 2016-10-24: 0.5 mg via INTRAVENOUS
  Filled 2016-10-24: qty 0.5

## 2016-10-24 MED ORDER — LISINOPRIL 5 MG PO TABS
5.0000 mg | ORAL_TABLET | Freq: Every day | ORAL | Status: DC
  Administered 2016-10-24: 5 mg via ORAL
  Filled 2016-10-24 (×3): qty 1

## 2016-10-24 MED ORDER — METHOCARBAMOL 1000 MG/10ML IJ SOLN
500.0000 mg | Freq: Four times a day (QID) | INTRAVENOUS | Status: DC | PRN
  Filled 2016-10-24: qty 5

## 2016-10-24 MED ORDER — ROPIVACAINE HCL 5 MG/ML IJ SOLN
INTRAMUSCULAR | Status: DC | PRN
  Administered 2016-10-24: 30 mL via PERINEURAL

## 2016-10-24 MED ORDER — METOCLOPRAMIDE HCL 5 MG PO TABS: 5.0000 mg | ORAL_TABLET | Freq: Three times a day (TID) | ORAL | Status: DC | PRN

## 2016-10-24 MED ORDER — CEFAZOLIN SODIUM-DEXTROSE 2-4 GM/100ML-% IV SOLN
2.0000 g | INTRAVENOUS | Status: AC
  Administered 2016-10-24: 2 g via INTRAVENOUS
  Filled 2016-10-24: qty 100

## 2016-10-24 MED ORDER — PRAVASTATIN SODIUM 40 MG PO TABS
40.0000 mg | ORAL_TABLET | Freq: Every day | ORAL | Status: DC
  Administered 2016-10-24: 40 mg via ORAL
  Filled 2016-10-24: qty 1

## 2016-10-24 MED ORDER — METFORMIN HCL 500 MG PO TABS
500.0000 mg | ORAL_TABLET | Freq: Two times a day (BID) | ORAL | Status: DC
  Filled 2016-10-24: qty 1

## 2016-10-24 MED ORDER — LORATADINE 10 MG PO TABS
10.0000 mg | ORAL_TABLET | Freq: Every day | ORAL | Status: DC
  Administered 2016-10-24 – 2016-10-25 (×2): 10 mg via ORAL
  Filled 2016-10-24 (×2): qty 1

## 2016-10-24 MED ORDER — FENTANYL CITRATE (PF) 250 MCG/5ML IJ SOLN
INTRAMUSCULAR | Status: AC
  Filled 2016-10-24: qty 5

## 2016-10-24 MED ORDER — PROPOFOL 10 MG/ML IV BOLUS
INTRAVENOUS | Status: AC
  Filled 2016-10-24: qty 20

## 2016-10-24 MED ORDER — SITAGLIPTIN PHOS-METFORMIN HCL 50-500 MG PO TABS: 1.0000 | ORAL_TABLET | Freq: Two times a day (BID) | ORAL | Status: DC

## 2016-10-24 MED ORDER — SODIUM CHLORIDE 0.9 % IV SOLN: INTRAVENOUS | Status: DC

## 2016-10-24 MED ORDER — MIDAZOLAM HCL 2 MG/2ML IJ SOLN
2.0000 mg | Freq: Once | INTRAMUSCULAR | Status: AC
  Administered 2016-10-24: 2 mg via INTRAVENOUS
  Filled 2016-10-24: qty 2

## 2016-10-24 MED ORDER — MEPERIDINE HCL 25 MG/ML IJ SOLN: 6.2500 mg | INTRAMUSCULAR | Status: DC | PRN

## 2016-10-24 MED ORDER — OXYCODONE HCL 5 MG PO TABS
5.0000 mg | ORAL_TABLET | ORAL | Status: DC | PRN
  Filled 2016-10-24: qty 1

## 2016-10-24 MED ORDER — CHLORHEXIDINE GLUCONATE 4 % EX LIQD: 60.0000 mL | Freq: Once | CUTANEOUS | Status: DC

## 2016-10-24 MED ORDER — ONDANSETRON HCL 4 MG PO TABS: 4.0000 mg | ORAL_TABLET | Freq: Four times a day (QID) | ORAL | Status: DC | PRN

## 2016-10-24 MED ORDER — LACTATED RINGERS IV SOLN
INTRAVENOUS | Status: DC | PRN
  Administered 2016-10-24 (×2): via INTRAVENOUS

## 2016-10-24 MED ORDER — LINAGLIPTIN 5 MG PO TABS
5.0000 mg | ORAL_TABLET | Freq: Two times a day (BID) | ORAL | Status: DC
  Filled 2016-10-24: qty 1

## 2016-10-24 MED ORDER — TRAMADOL HCL 50 MG PO TABS
50.0000 mg | ORAL_TABLET | Freq: Four times a day (QID) | ORAL | Status: DC | PRN
  Administered 2016-10-25 (×2): 100 mg via ORAL
  Filled 2016-10-24 (×2): qty 2

## 2016-10-24 MED ORDER — ACETAMINOPHEN 650 MG RE SUPP: 650.0000 mg | Freq: Four times a day (QID) | RECTAL | Status: DC | PRN

## 2016-10-24 MED ORDER — HYDROMORPHONE HCL 1 MG/ML IJ SOLN: 0.2500 mg | INTRAMUSCULAR | Status: DC | PRN

## 2016-10-24 MED ORDER — METHOCARBAMOL 500 MG PO TABS
500.0000 mg | ORAL_TABLET | Freq: Four times a day (QID) | ORAL | Status: DC | PRN
  Administered 2016-10-24 – 2016-10-25 (×2): 500 mg via ORAL
  Filled 2016-10-24 (×2): qty 1

## 2016-10-24 MED ORDER — FENTANYL CITRATE (PF) 100 MCG/2ML IJ SOLN
INTRAMUSCULAR | Status: DC | PRN
  Administered 2016-10-24 (×2): 25 ug via INTRAVENOUS

## 2016-10-24 MED ORDER — PROPOFOL 10 MG/ML IV BOLUS
INTRAVENOUS | Status: DC | PRN
  Administered 2016-10-24: 200 mg via INTRAVENOUS

## 2016-10-24 MED ORDER — LIDOCAINE HCL (CARDIAC) 20 MG/ML IV SOLN
INTRAVENOUS | Status: DC | PRN
  Administered 2016-10-24: 50 mg via INTRAVENOUS

## 2016-10-24 SURGICAL SUPPLY — 71 items
APL SKNCLS STERI-STRIP NONHPOA (GAUZE/BANDAGES/DRESSINGS) ×1
BANDAGE ACE 4X5 VEL STRL LF (GAUZE/BANDAGES/DRESSINGS) ×3 IMPLANT
BANDAGE ACE 6X5 VEL STRL LF (GAUZE/BANDAGES/DRESSINGS) ×3 IMPLANT
BANDAGE ESMARK 6X9 LF (GAUZE/BANDAGES/DRESSINGS) ×1 IMPLANT
BENZOIN TINCTURE PRP APPL 2/3 (GAUZE/BANDAGES/DRESSINGS) ×2 IMPLANT
BIT DRILL 2.7XCANN QCK CNCT (BIT) IMPLANT
BIT DRILL CANN 2.7 (BIT) ×2
BIT DRILL CANN 2.7MM (BIT) ×1
BIT DRL 2.7XCANN QCK CNCT (BIT) ×1
BLADE CLIPPER SURG (BLADE) ×3 IMPLANT
BNDG CMPR 9X6 STRL LF SNTH (GAUZE/BANDAGES/DRESSINGS) ×1
BNDG COHESIVE 4X5 TAN STRL (GAUZE/BANDAGES/DRESSINGS) ×2 IMPLANT
BNDG ESMARK 6X9 LF (GAUZE/BANDAGES/DRESSINGS) ×3
CANISTER SUCT 3000ML PPV (MISCELLANEOUS) ×3 IMPLANT
CLOSURE STERI-STRIP 1/2X4 (GAUZE/BANDAGES/DRESSINGS) ×2
CLSR STERI-STRIP ANTIMIC 1/2X4 (GAUZE/BANDAGES/DRESSINGS) ×2 IMPLANT
COVER SURGICAL LIGHT HANDLE (MISCELLANEOUS) ×3 IMPLANT
CUFF TOURNIQUET SINGLE 34IN LL (TOURNIQUET CUFF) IMPLANT
CUFF TOURNIQUET SINGLE 44IN (TOURNIQUET CUFF) IMPLANT
DRAPE C-ARM 42X72 X-RAY (DRAPES) ×3 IMPLANT
DRAPE C-ARMOR (DRAPES) ×3 IMPLANT
DRAPE INCISE IOBAN 66X45 STRL (DRAPES) ×2 IMPLANT
DRAPE U-SHAPE 47X51 STRL (DRAPES) ×3 IMPLANT
DRSG ADAPTIC 3X8 NADH LF (GAUZE/BANDAGES/DRESSINGS) ×3 IMPLANT
DRSG EMULSION OIL 3X3 NADH (GAUZE/BANDAGES/DRESSINGS) ×3 IMPLANT
DRSG PAD ABDOMINAL 8X10 ST (GAUZE/BANDAGES/DRESSINGS) ×3 IMPLANT
ELECT REM PT RETURN 9FT ADLT (ELECTROSURGICAL) ×3
ELECTRODE REM PT RTRN 9FT ADLT (ELECTROSURGICAL) ×1 IMPLANT
GAUZE SPONGE 4X4 12PLY STRL (GAUZE/BANDAGES/DRESSINGS) ×2 IMPLANT
GLOVE BIOGEL PI IND STRL 8 (GLOVE) ×2 IMPLANT
GLOVE BIOGEL PI INDICATOR 8 (GLOVE) ×4
GLOVE ORTHO TXT STRL SZ7.5 (GLOVE) ×6 IMPLANT
GOWN STRL REUS W/ TWL LRG LVL3 (GOWN DISPOSABLE) ×1 IMPLANT
GOWN STRL REUS W/ TWL XL LVL3 (GOWN DISPOSABLE) ×1 IMPLANT
GOWN STRL REUS W/TWL 2XL LVL3 (GOWN DISPOSABLE) ×3 IMPLANT
GOWN STRL REUS W/TWL LRG LVL3 (GOWN DISPOSABLE) ×3
GOWN STRL REUS W/TWL XL LVL3 (GOWN DISPOSABLE) ×3
IMMOBILIZER KNEE 22 UNIV (SOFTGOODS) ×2 IMPLANT
K-WIRE ACE 1.6X6 (WIRE) ×3
KIT BASIN OR (CUSTOM PROCEDURE TRAY) ×3 IMPLANT
KIT ROOM TURNOVER OR (KITS) ×3 IMPLANT
KWIRE ACE 1.6X6 (WIRE) IMPLANT
NEEDLE 22X1 1/2 (OR ONLY) (NEEDLE) IMPLANT
NS IRRIG 1000ML POUR BTL (IV SOLUTION) ×3 IMPLANT
PACK ORTHO EXTREMITY (CUSTOM PROCEDURE TRAY) ×3 IMPLANT
PAD ABD 8X10 STRL (GAUZE/BANDAGES/DRESSINGS) ×2 IMPLANT
PAD ARMBOARD 7.5X6 YLW CONV (MISCELLANEOUS) ×6 IMPLANT
PAD CAST 4YDX4 CTTN HI CHSV (CAST SUPPLIES) ×2 IMPLANT
PADDING CAST COTTON 4X4 STRL (CAST SUPPLIES) ×3
PADDING CAST COTTON 6X4 STRL (CAST SUPPLIES) ×2 IMPLANT
SCREW CANN 4X42 (Screw) ×2 IMPLANT
SCREW CANNULATED 4.0X36 1/2 (Screw) ×2 IMPLANT
SPONGE LAP 4X18 X RAY DECT (DISPOSABLE) ×3 IMPLANT
STAPLER VISISTAT 35W (STAPLE) ×3 IMPLANT
STOCKINETTE IMPERVIOUS LG (DRAPES) ×2 IMPLANT
SUCTION FRAZIER HANDLE 10FR (MISCELLANEOUS) ×2
SUCTION TUBE FRAZIER 10FR DISP (MISCELLANEOUS) ×1 IMPLANT
SUT VIC AB 0 CT1 27 (SUTURE)
SUT VIC AB 0 CT1 27XBRD ANBCTR (SUTURE) ×1 IMPLANT
SUT VIC AB 2-0 CT1 27 (SUTURE) ×6
SUT VIC AB 2-0 CT1 TAPERPNT 27 (SUTURE) ×1 IMPLANT
SUT VIC AB 3-0 FS2 27 (SUTURE) ×2 IMPLANT
SUT VIC AB 3-0 X1 27 (SUTURE) ×2 IMPLANT
SYR CONTROL 10ML LL (SYRINGE) IMPLANT
TOWEL OR 17X24 6PK STRL BLUE (TOWEL DISPOSABLE) ×3 IMPLANT
TOWEL OR 17X26 10 PK STRL BLUE (TOWEL DISPOSABLE) ×3 IMPLANT
TUBE CONNECTING 12'X1/4 (SUCTIONS) ×1
TUBE CONNECTING 12X1/4 (SUCTIONS) ×2 IMPLANT
UNDERPAD 30X30 (UNDERPADS AND DIAPERS) ×3 IMPLANT
WATER STERILE IRR 1000ML POUR (IV SOLUTION) ×3 IMPLANT
YANKAUER SUCT BULB TIP NO VENT (SUCTIONS) ×2 IMPLANT

## 2016-10-24 NOTE — H&P (Signed)
Sharon Leonard is an 61 y.o. female.   Chief Complaint: left patella fracture HPI: injury with left patella fx, xrays show 2 mm displacement without healing. Original xrays 09/20/16  After OTJI on 09/19/16. She works as a Lawyer.   Past Medical History:  Diagnosis Date  . Arthritis    hips- bilateral  . Diabetes mellitus without complication (HCC)   . History of hiatal hernia   . History of kidney stones    passed spont.   . Hypertension     Past Surgical History:  Procedure Laterality Date  . ABDOMINAL HYSTERECTOMY    . APPENDECTOMY    . UTERINE FIBROID SURGERY     2x- surgery for fibroids    History reviewed. No pertinent family history. Social History:  reports that she has never smoked. She has never used smokeless tobacco. She reports that she does not drink alcohol or use drugs.  Allergies:  Allergies  Allergen Reactions  . Raspberry Hives  . Strawberry Extract Hives  . Sulfa Antibiotics Swelling    SWELLING REACTION UNSPECIFIED   . Latex Rash  . Shellfish Allergy Itching    Medications Prior to Admission  Medication Sig Dispense Refill  . ibuprofen (ADVIL,MOTRIN) 200 MG tablet Take 400 mg by mouth 2 (two) times daily as needed for mild pain.    Marland Kitchen JANUMET 50-500 MG tablet TAKE 1 TABLET BY MOUTH 2 (TWO) TIMES A DAY WITH MEALS.  1  . lisinopril (PRINIVIL,ZESTRIL) 5 MG tablet Take 5 mg by mouth daily.    . methocarbamol (ROBAXIN) 500 MG tablet Take 1 tablet (500 mg total) by mouth every 6 (six) hours as needed for muscle spasms. 60 tablet 0  . methocarbamol (ROBAXIN) 500 MG tablet Take 1 tablet (500 mg total) by mouth every 6 (six) hours as needed for muscle spasms. 60 tablet 0  . Multiple Vitamins-Minerals (ADVANCED DIABETIC MULTIVITAMIN PO) Take 1 tablet by mouth 2 (two) times daily.    . pravastatin (PRAVACHOL) 40 MG tablet Take 40 mg by mouth daily.    Marland Kitchen tobramycin-dexamethasone (TOBRADEX) ophthalmic solution Place 1 drop into both eyes 4 (four) times daily as needed.     . traMADol (ULTRAM) 50 MG tablet Take 1 tablet (50 mg total) by mouth every 6 (six) hours as needed. (Patient taking differently: Take 50 mg by mouth every 6 (six) hours as needed for moderate pain. ) 40 tablet 0  . cetirizine (ZYRTEC) 10 MG tablet Take 10 mg by mouth daily as needed for allergies.     Marland Kitchen oxyCODONE-acetaminophen (PERCOCET/ROXICET) 5-325 MG tablet Take 1 tablet by mouth every 6 (six) hours as needed for severe pain. (Patient not taking: Reported on 10/19/2016) 10 tablet 0    Results for orders placed or performed during the hospital encounter of 10/24/16 (from the past 48 hour(s))  Glucose, capillary     Status: Abnormal   Collection Time: 10/24/16  1:10 PM  Result Value Ref Range   Glucose-Capillary 164 (H) 65 - 99 mg/dL   Comment 1 Notify RN    No results found.  Review of Systems  Constitutional: Negative.   HENT: Negative.   Eyes: Negative.   Respiratory: Negative.   Gastrointestinal: Negative.   Genitourinary: Negative.        TAH, appy.   Musculoskeletal:       Acute patella fx  Skin: Negative.   Neurological: Negative.   Endo/Heme/Allergies: Negative.   Psychiatric/Behavioral: Negative.     Blood pressure (!) 158/52, pulse 63,  temperature 98 F (36.7 C), temperature source Oral, resp. rate 18, height 5\' 5"  (1.651 m), weight 156 lb (70.8 kg), SpO2 99 %. Physical Exam  Constitutional: She is oriented to person, place, and time. She appears well-developed and well-nourished.  HENT:  Head: Normocephalic.  Eyes: Pupils are equal, round, and reactive to light.  Neck: Normal range of motion.  Cardiovascular: Normal rate.   Respiratory: Effort normal.  GI: Soft. Bowel sounds are normal. She exhibits no distension. There is no tenderness.  Musculoskeletal:  Left knee effusion. Pulses nl   Neurological: She is alert and oriented to person, place, and time.  Skin: Skin is warm and dry.     Assessment/Plan Left patella fx with displacement for ORIF.  Procedure discussed, she requests we proceed.   [name], MD 10/24/2016, 3:27 PM

## 2016-10-24 NOTE — Op Note (Signed)
Preop diagnosis: Left transverse patella fracture  Postop diagnosis: Same  Procedure: Open reduction internal fixation left patella  Surgeon: Annell Greening M.D.  Assistant: Zonia Kief PA-C medically necessary and present for the entire procedure  Anesthesia Gen. LMA  Tourniquet time 12 minutes  Procedure after standard prepping and draping proximal thigh tourniquet extremity sheets and drapes with impervious stockinette and Coban sterile skin marker Betadine Steri-Drape Esmarch tourniquet wrapping before tourniquet inflation with tourniquet at 350 was performed with timeout procedure. Ancef was given prophylactically.  Midline incision was made. Retinaculum was intact on both sides. K wire was placed from distal to proximal to the midportion of the patella just touching the opposite cortex measured initially a measured and a 46 mm screw was placed which was too long. Was packed out 40 mm screw was placed. K wire was then placed more medial parallel and this measured 52 and the 46 screws placed which just came to the cortex and compress the fracture which is being compressed by Zonia Kief PA-C for anatomic positioning and tightening of the screws with compression. Fracture was reduced and compressed under fluoroscopic AP and lateral x-rays. Irrigation was performed retinaculum was checked on each side and again was intact. 2-0 Vicryl subtendinous tissue septic her closure tincture benzoin Steri-Strips postop dressing the immobilizer was applied.

## 2016-10-24 NOTE — Anesthesia Procedure Notes (Signed)
Anesthesia Regional Block: Femoral nerve block   Pre-Anesthetic Checklist: ,, timeout performed, Correct Patient, Correct Site, Correct Laterality, Correct Procedure, Correct Position, site marked, Risks and benefits discussed,  Surgical consent,  Pre-op evaluation,  At surgeon's request and post-op pain management  Laterality: Left  Prep: Dura Prep       Needles:  Injection technique: Single-shot  Needle Type: Stimiplex     Needle Length: 9cm  Needle Gauge: 21     Additional Needles:   Procedures: ultrasound guided,,,,,,,,  Narrative:  Start time: 10/24/2016 2:32 PM End time: 10/24/2016 2:37 PM Injection made incrementally with aspirations every 5 mL.  Performed by: Personally  Anesthesiologist: Anitra Lauth RAY

## 2016-10-24 NOTE — Anesthesia Preprocedure Evaluation (Signed)
Anesthesia Evaluation  Patient identified by MRN, date of birth, ID band Patient awake    Reviewed: Allergy & Precautions, NPO status , Patient's Chart, lab work & pertinent test results  Airway Mallampati: II  TM Distance: >3 FB Neck ROM: Full    Dental no notable dental hx.    Pulmonary neg pulmonary ROS,    Pulmonary exam normal breath sounds clear to auscultation       Cardiovascular hypertension, Pt. on medications negative cardio ROS Normal cardiovascular exam Rhythm:Regular Rate:Normal     Neuro/Psych negative neurological ROS  negative psych ROS   GI/Hepatic negative GI ROS, Neg liver ROS,   Endo/Other  negative endocrine ROSdiabetes, Type 2, Oral Hypoglycemic Agents  Renal/GU negative Renal ROS  negative genitourinary   Musculoskeletal negative musculoskeletal ROS (+)   Abdominal   Peds negative pediatric ROS (+)  Hematology negative hematology ROS (+)   Anesthesia Other Findings   Reproductive/Obstetrics negative OB ROS                             Anesthesia Physical Anesthesia Plan  ASA: III  Anesthesia Plan: General   Post-op Pain Management:  Regional for Post-op pain   Induction: Intravenous  PONV Risk Score and Plan: 3 and Ondansetron, Dexamethasone and Midazolam  Airway Management Planned: LMA  Additional Equipment:   Intra-op Plan:   Post-operative Plan: Extubation in OR  Informed Consent: I have reviewed the patients History and Physical, chart, labs and discussed the procedure including the risks, benefits and alternatives for the proposed anesthesia with the patient or authorized representative who has indicated his/her understanding and acceptance.   Dental advisory given  Plan Discussed with: CRNA  Anesthesia Plan Comments:         Anesthesia Quick Evaluation

## 2016-10-24 NOTE — Progress Notes (Signed)
Pt stated that Oxycodone causes itching; with that being said, medication was withheld from being given. An hour after, pt complained of itching all over in spite of oxycodone not being given. MD on call (Dr. Lajoyce Corners) paged and notified of event and gave new orders; Oxycodone was discontinued. Benadryl and Tramadol were ordered for itching and pain control. Will continue to monitor.

## 2016-10-24 NOTE — Transfer of Care (Signed)
Immediate Anesthesia Transfer of Care Note  Patient: Sharon Leonard  Procedure(s) Performed: Procedure(s): OPEN REDUCTION INTERNAL (ORIF) FIXATION LEFT PATELLA (Left)  Patient Location: PACU  Anesthesia Type:General  Level of Consciousness: sedated  Airway & Oxygen Therapy: Patient Spontanous Breathing and Patient connected to nasal cannula oxygen  Post-op Assessment: Report given to RN and Post -op Vital signs reviewed and stable  Post vital signs: Reviewed and stable  Last Vitals:  Vitals:   10/24/16 1312 10/24/16 1705  BP: (!) 158/52 (!) (P) 160/89  Pulse: 63 (P) 87  Resp: 18 (P) 13  Temp: 36.7 C (!) (P) 36.4 C  SpO2: 99% (P) 100%    Last Pain:  Vitals:   10/24/16 1705  TempSrc:   PainSc: (P) Asleep      Patients Stated Pain Goal: 2 (10/24/16 1304)  Complications: No apparent anesthesia complications

## 2016-10-24 NOTE — Anesthesia Procedure Notes (Signed)
Procedure Name: LMA Insertion Date/Time: 10/24/2016 3:52 PM Performed by: Gwenyth Allegra Pre-anesthesia Checklist: Patient identified, Emergency Drugs available, Suction available, Patient being monitored and Timeout performed Patient Re-evaluated:Patient Re-evaluated prior to induction Oxygen Delivery Method: Circle system utilized Preoxygenation: Pre-oxygenation with 100% oxygen Induction Type: IV induction Ventilation: Mask ventilation without difficulty LMA: LMA inserted LMA Size: 4.0 Placement Confirmation: positive ETCO2 and breath sounds checked- equal and bilateral Tube secured with: Tape Dental Injury: Teeth and Oropharynx as per pre-operative assessment

## 2016-10-24 NOTE — Progress Notes (Signed)
Orthopedic Tech Progress Note Patient Details:  Sharon Leonard 28-Dec-1955 [id number] Patient has brace on.  Patient ID: [name], female   DOB: 08/01/1955, 61 y.o.   MRN: [id number]   [name] 10/24/2016, 8:49 PM

## 2016-10-24 NOTE — Interval H&P Note (Signed)
History and Physical Interval Note:  10/24/2016 3:31 PM  Sharon Leonard  has presented today for surgery, with the diagnosis of Left Patella Fracture  The various methods of treatment have been discussed with the patient and family. After consideration of risks, benefits and other options for treatment, the patient has consented to  Procedure(s): OPEN REDUCTION INTERNAL (ORIF) FIXATION LEFT PATELLA (Left) as a surgical intervention .  The patient's history has been reviewed, patient examined, no change in status, stable for surgery.  I have reviewed the patient's chart and labs.  Questions were answered to the patient's satisfaction.     [name]

## 2016-10-25 ENCOUNTER — Encounter (HOSPITAL_COMMUNITY): Payer: Self-pay | Admitting: Orthopaedic Surgery

## 2016-10-25 DIAGNOSIS — S82002A Unspecified fracture of left patella, initial encounter for closed fracture: Secondary | ICD-10-CM | POA: Diagnosis not present

## 2016-10-25 LAB — BASIC METABOLIC PANEL
Anion gap: 10 (ref 5–15)
BUN: 6 mg/dL (ref 6–20)
CHLORIDE: 99 mmol/L — AB (ref 101–111)
CO2: 28 mmol/L (ref 22–32)
CREATININE: 0.81 mg/dL (ref 0.44–1.00)
Calcium: 9.4 mg/dL (ref 8.9–10.3)
GFR calc non Af Amer: >60 mL/min (ref >60)
Glucose, Bld: 156 mg/dL — ABNORMAL HIGH (ref 65–99)
POTASSIUM: 3.8 mmol/L (ref 3.5–5.1)
SODIUM: 137 mmol/L (ref 135–145)

## 2016-10-25 LAB — GLUCOSE, CAPILLARY
GLUCOSE-CAPILLARY: 191 mg/dL — AB (ref 65–99)
GLUCOSE-CAPILLARY: 129 mg/dL — AB (ref 65–99)

## 2016-10-25 MED ORDER — ASPIRIN 325 MG PO TABS: 325.0000 mg | ORAL_TABLET | Freq: Every day | ORAL | Status: AC

## 2016-10-25 MED ORDER — OXYCODONE-ACETAMINOPHEN 5-325 MG PO TABS: 1.0000 | ORAL_TABLET | ORAL | 0 refills | Status: DC | PRN

## 2016-10-25 NOTE — Progress Notes (Signed)
Patient is discharged from room 3C10 at this time. Alert and in stable condition. IV site d/c'd and instructions read to patient and sister with understanding verbalized. Left unit via wheelchair with all belongings at side.

## 2016-10-25 NOTE — Evaluation (Signed)
Physical Therapy Evaluation and Discharge  Patient Details Name: Sharon Leonard MRN: [id number] DOB: 11/15/1955 Today's Date: 10/25/2016   History of Present Illness  Pt is a 61yo female s/p ORIF of L patella fracture. PMH: DM, hiatal hernia, HTN. PSH: appendectomy, abd hysterectomy.  Clinical Impression  Pt admitted with above. Pt has been L LE NWB for 6 weeks and using crutches. Pt functioning at supervision level and will have 24/7 supervision available. Pt with no further acute PT needs at this time. PT SIGNING OFF. PLease re-order if needed in future.    Follow Up Recommendations Outpatient PT;Supervision - Intermittent (when MD clears pt)    Equipment Recommendations  None recommended by PT    Recommendations for Other Services       Precautions / Restrictions Precautions Precautions: Fall Precaution Comments: no L knee ROM Required Braces or Orthoses: Knee Immobilizer - Left Knee Immobilizer - Left: On at all times Restrictions Weight Bearing Restrictions: Yes LLE Weight Bearing: Non weight bearing Other Position/Activity Restrictions: NO ROM to L knee      Mobility  Bed Mobility Overal bed mobility: Modified Independent             General bed mobility comments: pt sleeps in recliner  Transfers Overall transfer level: Needs assistance Equipment used: Crutches Transfers: Sit to/from Stand Sit to Stand: Supervision         General transfer comment: v/c's for safe crutch management during sit to stand/stand to sit transfers  Ambulation/Gait Ambulation/Gait assistance: Supervision Ambulation Distance (Feet): 120 Feet Assistive device: Crutches Gait Pattern/deviations: Step-to pattern Gait velocity: decr Gait velocity interpretation: Below normal speed for age/gender General Gait Details: pt able to maintain L LE NWB  Stairs Stairs: Yes Stairs assistance: Min guard Stair Management: One rail Left;Step to pattern;Backwards;With crutches Number of  Stairs: 3 (x2 sets) General stair comments: pt instructed on bilat crutches and crutch/handrail pt with good return demo and sister present to observe  Wheelchair Mobility    Modified Rankin (Stroke Patients Only)       Balance Overall balance assessment: Needs assistance (needs crutches due to L LE NWB)                                           Pertinent Vitals/Pain Pain Assessment: 0-10 Pain Score: 5  Pain Location: L knee Pain Descriptors / Indicators: Aching Pain Intervention(s): Monitored during session    Home Living Family/patient expects to be discharged to:: Private residence Living Arrangements: Other relatives (sister) Available Help at Discharge: Family;Available 24 hours/day (sister and boyfriend) Type of Home: House Home Access: Stairs to enter Entrance Stairs-Rails: Doctor, general practice of Steps: 2 Home Layout: One level Home Equipment: Crutches;Shower seat;Wheelchair - manual      Prior Function Level of Independence: Independent with assistive device(s)         Comments: pt has been L LE NWB since july due to patellar fx. Pt has been using crutches for short distance amb and w/c for community distance     Hand Dominance   Dominant Hand: Right    Extremity/Trunk Assessment   Upper Extremity Assessment Upper Extremity Assessment: Overall WFL for tasks assessed    Lower Extremity Assessment Lower Extremity Assessment: LLE deficits/detail LLE Deficits / Details: no ROM due to orders, ankle and hip [facility]    Cervical / Trunk Assessment Cervical / Trunk Assessment: Normal  Communication   Communication: No difficulties  Cognition Arousal/Alertness: Awake/alert Behavior During Therapy: WFL for tasks assessed/performed Overall Cognitive Status: Within Functional Limits for tasks assessed                                        General Comments      Exercises     Assessment/Plan    PT  Assessment Patent does not need any further PT services  PT Problem List         PT Treatment Interventions      PT Goals (Current goals can be found in the Care Plan section)  Acute Rehab PT Goals Patient Stated Goal: home PT Goal Formulation: All assessment and education complete, DC therapy    Frequency     Barriers to discharge        Co-evaluation               AM-PAC PT "6 Clicks" Daily Activity  Outcome Measure Difficulty turning over in bed (including adjusting bedclothes, sheets and blankets)?: None Difficulty moving from lying on back to sitting on the side of the bed? : A Little Difficulty sitting down on and standing up from a chair with arms (e.g., wheelchair, bedside commode, etc,.)?: A Little Help needed moving to and from a bed to chair (including a wheelchair)?: A Little Help needed walking in hospital room?: A Little Help needed climbing 3-5 steps with a railing? : A Little 6 Click Score: 19    End of Session Equipment Utilized During Treatment: Gait belt Activity Tolerance: Patient tolerated treatment well Patient left: in bed;with call bell/phone within reach;with family/visitor present Nurse Communication: Mobility status PT Visit Diagnosis: Difficulty in walking, not elsewhere classified (R26.2)    Time: 9935-7017 PT Time Calculation (min) (ACUTE ONLY): 18 min   Charges:   PT Evaluation $PT Eval Moderate Complexity: 1 Mod     PT G Codes:   PT G-Codes **NOT FOR INPATIENT CLASS** Functional Assessment Tool Used: Clinical judgement Functional Limitation: Mobility: Walking and moving around Mobility: Walking and Moving Around Current Status (B9390): At least 1 percent but less than 20 percent impaired, limited or restricted Mobility: Walking and Moving Around Goal Status [phone]): At least 1 percent but less than 20 percent impaired, limited or restricted Mobility: Walking and Moving Around Discharge Status [phone]): At least 1 percent but less  than 20 percent impaired, limited or restricted    Lewis Shock, PT, DPT Pager #: [phone] Office #: [phone]   Sharon Leonard 10/25/2016, 9:43 AM

## 2016-10-25 NOTE — Anesthesia Postprocedure Evaluation (Signed)
Anesthesia Post Note  Patient: Sharon Leonard  Procedure(s) Performed: Procedure(s) (LRB): OPEN REDUCTION INTERNAL (ORIF) FIXATION LEFT PATELLA (Left)     Patient location during evaluation: PACU Anesthesia Type: General Level of consciousness: sedated and patient cooperative Pain management: pain level controlled Vital Signs Assessment: post-procedure vital signs reviewed and stable Respiratory status: spontaneous breathing Cardiovascular status: stable Anesthetic complications: no    Last Vitals:  Vitals:   10/25/16 0400 10/25/16 0802  BP: (!) 134/58 (!) 114/53  Pulse: 71 65  Resp: 18 18  Temp: 36.8 C 36.8 C  SpO2: 100% 97%    Last Pain:  Vitals:   10/25/16 0846  TempSrc:   PainSc: 6    Pain Goal: Patients Stated Pain Goal: 2 (10/25/16 0036)               Lewie Loron

## 2016-10-25 NOTE — Discharge Instructions (Signed)
In 48 hrs after surgery you may remove dressing to shower. Dry off after shower and reapply knee immobilizer.  Remove immobilizer twice daily to work on leg lifts and gentle bending of knee. Knee immobilizer on when you get up out of bed. See Dr. Ophelia Charter in one week

## 2016-10-25 NOTE — Progress Notes (Signed)
   Subjective: 1 Day Post-Op Procedure(s) (LRB): OPEN REDUCTION INTERNAL (ORIF) FIXATION LEFT PATELLA (Left) Patient reports pain as moderate.    Objective: Vital signs in last 24 hours: Temp:  [97.5 F (36.4 C)-98.3 F (36.8 C)] 98.3 F (36.8 C) (08/28 0400) Pulse Rate:  [58-87] 71 (08/28 0400) Resp:  [12-18] 18 (08/28 0400) BP: (120-160)/(52-89) 134/58 (08/28 0400) SpO2:  [98 %-100 %] 100 % (08/28 0400) Weight:  [156 lb (70.8 kg)] 156 lb (70.8 kg) (08/27 1304)  Intake/Output from previous day: 08/27 0701 - 08/28 0700 In: 1000 [I.V.:1000] Out: -  Intake/Output this shift: No intake/output data recorded.  No results for input(s): HGB in the last 72 hours. No results for input(s): WBC, RBC, HCT, PLT in the last 72 hours.  Recent Labs  10/25/16 0507  NA 137  K 3.8  CL 99*  CO2 28  BUN 6  CREATININE 0.81  GLUCOSE 156*  CALCIUM 9.4   No results for input(s): LABPT, INR in the last 72 hours.  Neurologically intact Dg Knee 1-2 Views Left  Result Date: 10/24/2016 CLINICAL DATA:  Left patellar fracture fixation. EXAM: DG C-ARM 61-120 MIN; LEFT KNEE - 1-2 VIEW COMPARISON:  10/05/2016 FINDINGS: AP and lateral views of the left knee were acquired intraoperatively. C-arm fluoroscopic views are provided. Fine bony detail is limited. 10 seconds of fluoroscopic time was utilized by report. Two cannulated screws are seen traversing a known transverse fracture through the lower pole of the patella. Fine bony detail is limited of the cm fluoroscopic study. IMPRESSION: ORIF of the left patella.  Alignment is near anatomic. 10 seconds of fluoroscopic time was utilized. Electronically Signed   By: Tollie Eth M.D.   On: 10/24/2016 16:45   Dg C-arm 1-60 Min  Result Date: 10/24/2016 CLINICAL DATA:  Left patellar fracture fixation. EXAM: DG C-ARM 61-120 MIN; LEFT KNEE - 1-2 VIEW COMPARISON:  10/05/2016 FINDINGS: AP and lateral views of the left knee were acquired intraoperatively. C-arm  fluoroscopic views are provided. Fine bony detail is limited. 10 seconds of fluoroscopic time was utilized by report. Two cannulated screws are seen traversing a known transverse fracture through the lower pole of the patella. Fine bony detail is limited of the cm fluoroscopic study. IMPRESSION: ORIF of the left patella.  Alignment is near anatomic. 10 seconds of fluoroscopic time was utilized. Electronically Signed   By: Tollie Eth M.D.   On: 10/24/2016 16:45    Assessment/Plan: 1 Day Post-Op Procedure(s) (LRB): OPEN REDUCTION INTERNAL (ORIF) FIXATION LEFT PATELLA (Left) Up with therapy this AM then discharge home. Office one week.   Sharon Leonard 10/25/2016, 7:29 AM

## 2016-10-27 ENCOUNTER — Telehealth (INDEPENDENT_AMBULATORY_CARE_PROVIDER_SITE_OTHER): Payer: Self-pay | Admitting: Orthopaedic Surgery

## 2016-10-27 NOTE — Telephone Encounter (Signed)
She will take one forth or one half tablet. She can use benadryl for itching. FYI

## 2016-10-27 NOTE — Telephone Encounter (Signed)
Patient called advised she can not take Oxycodone. Patient said she have a Rx for Oxycodone. Patient asked if she can get a refill on Tramadol? The number to contact patient is [phone]

## 2016-10-27 NOTE — Telephone Encounter (Signed)
Ok for script for Tramadol?

## 2016-10-27 NOTE — Telephone Encounter (Signed)
noted 

## 2016-10-28 NOTE — Discharge Summary (Signed)
Patient ID: Sharon Leonard MRN: [id number] DOB/AGE: 1955-05-04 61 y.o.  Admit date: 10/24/2016 Discharge date: 10/28/2016  Admission Diagnoses:  Active Problems:   Left patella fracture   Discharge Diagnoses:  Active Problems:   Left patella fracture  status post Procedure(s): OPEN REDUCTION INTERNAL (ORIF) FIXATION LEFT PATELLA  Past Medical History:  Diagnosis Date  . Arthritis    hips- bilateral  . Diabetes mellitus without complication (HCC)   . History of hiatal hernia   . History of kidney stones    passed spont.   . Hypertension     Surgeries: Procedure(s): OPEN REDUCTION INTERNAL (ORIF) FIXATION LEFT PATELLA on 10/24/2016   Consultants:   Discharged Condition: Improved  Hospital Course: Sharon Leonard is an 61 y.o. female who was admitted 10/24/2016 for operative treatment of Patella fracture. Patient failed conservative treatments (please see the history and physical for the specifics) and had severe unremitting pain that affects sleep, daily activities and work/hobbies. After pre-op clearance, the patient was taken to the operating room on 10/24/2016 and underwent  Procedure(s): OPEN REDUCTION INTERNAL (ORIF) FIXATION LEFT PATELLA.    Patient was given perioperative antibiotics:  Anti-infectives    Start     Dose/Rate Route Frequency Ordered Stop   10/24/16 2200  ceFAZolin (ANCEF) IVPB 1 g/50 mL premix     1 g 100 mL/hr over 30 Minutes Intravenous Every 8 hours 10/24/16 1915 10/24/16 2206   10/24/16 1300  ceFAZolin (ANCEF) IVPB 2g/100 mL premix     2 g 200 mL/hr over 30 Minutes Intravenous On call to O.R. 10/24/16 1257 10/24/16 1540       Patient was given sequential compression devices and early ambulation to prevent DVT.   Patient benefited maximally from hospital stay and there were no complications. At the time of discharge, the patient was urinating/moving their bowels without difficulty, tolerating a regular diet, pain is controlled with oral  pain medications and they have been cleared by PT/OT.   Recent vital signs: No data found.    Recent laboratory studies: No results for input(s): WBC, HGB, HCT, PLT, NA, K, CL, CO2, BUN, CREATININE, GLUCOSE, INR, CALCIUM in the last 72 hours.  Invalid input(s): PT, 2   Discharge Medications:   Allergies as of 10/25/2016      Reactions   Raspberry Hives   Strawberry Extract Hives   Sulfa Antibiotics Swelling   SWELLING REACTION UNSPECIFIED    Latex Rash   Shellfish Allergy Itching      Medication List    TAKE these medications   ADVANCED DIABETIC MULTIVITAMIN PO Take 1 tablet by mouth 2 (two) times daily.   aspirin 325 MG tablet Take 1 tablet (325 mg total) by mouth daily.   cetirizine 10 MG tablet Commonly known as:  ZYRTEC Take 10 mg by mouth daily as needed for allergies.   ibuprofen 200 MG tablet Commonly known as:  ADVIL,MOTRIN Take 400 mg by mouth 2 (two) times daily as needed for mild pain.   JANUMET 50-500 MG tablet Generic drug:  sitaGLIPtin-metformin TAKE 1 TABLET BY MOUTH 2 (TWO) TIMES A DAY WITH MEALS.   lisinopril 5 MG tablet Commonly known as:  PRINIVIL,ZESTRIL Take 5 mg by mouth daily.   methocarbamol 500 MG tablet Commonly known as:  ROBAXIN Take 1 tablet (500 mg total) by mouth every 6 (six) hours as needed for muscle spasms. What changed:  Another medication with the same name was removed. Continue taking this medication, and follow the directions you  see here.   oxyCODONE-acetaminophen 5-325 MG tablet Commonly known as:  ROXICET Take 1-2 tablets by mouth every 4 (four) hours as needed for severe pain. What changed:  how much to take  when to take this   pravastatin 40 MG tablet Commonly known as:  PRAVACHOL Take 40 mg by mouth daily.   tobramycin-dexamethasone ophthalmic solution Commonly known as:  TOBRADEX Place 1 drop into both eyes 4 (four) times daily as needed.   traMADol 50 MG tablet Commonly known as:  ULTRAM Take 1 tablet  (50 mg total) by mouth every 6 (six) hours as needed. What changed:  reasons to take this            Discharge Care Instructions        Start     Ordered   10/25/16 0000  aspirin 325 MG tablet  Daily     10/25/16 0732   10/25/16 0000  oxyCODONE-acetaminophen (ROXICET) 5-325 MG tablet  Every 4 hours PRN     10/25/16 0732      Diagnostic Studies: Dg Knee 1-2 Views Left  Result Date: 10/24/2016 CLINICAL DATA:  Left patellar fracture fixation. EXAM: DG C-ARM 61-120 MIN; LEFT KNEE - 1-2 VIEW COMPARISON:  10/05/2016 FINDINGS: AP and lateral views of the left knee were acquired intraoperatively. C-arm fluoroscopic views are provided. Fine bony detail is limited. 10 seconds of fluoroscopic time was utilized by report. Two cannulated screws are seen traversing a known transverse fracture through the lower pole of the patella. Fine bony detail is limited of the cm fluoroscopic study. IMPRESSION: ORIF of the left patella.  Alignment is near anatomic. 10 seconds of fluoroscopic time was utilized. Electronically Signed   By: Tollie Ethavid  Kwon M.D.   On: 10/24/2016 16:45   Dg C-arm 1-60 Min  Result Date: 10/24/2016 CLINICAL DATA:  Left patellar fracture fixation. EXAM: DG C-ARM 61-120 MIN; LEFT KNEE - 1-2 VIEW COMPARISON:  10/05/2016 FINDINGS: AP and lateral views of the left knee were acquired intraoperatively. C-arm fluoroscopic views are provided. Fine bony detail is limited. 10 seconds of fluoroscopic time was utilized by report. Two cannulated screws are seen traversing a known transverse fracture through the lower pole of the patella. Fine bony detail is limited of the cm fluoroscopic study. IMPRESSION: ORIF of the left patella.  Alignment is near anatomic. 10 seconds of fluoroscopic time was utilized. Electronically Signed   By: Tollie Ethavid  Kwon M.D.   On: 10/24/2016 16:45   Xr Knee 1-2 Views Left  Result Date: 10/05/2016 X-ray left knee does show a couple of millimeters of displacement compared to  previous x-ray 09/20/2016.     Follow-up Information    Eldred MangesYates, Mark C, MD Follow up in 1 week(s).   Specialty:  Orthopedic Surgery Contact information: [address] [place] [address] [phone]           Discharge Plan:  discharge to Home  Disposition:     Signed: Zonia KiefJames Guerry Covington  10/28/2016, 8:54 AM

## 2016-11-02 ENCOUNTER — Ambulatory Visit (INDEPENDENT_AMBULATORY_CARE_PROVIDER_SITE_OTHER): Payer: Self-pay

## 2016-11-02 ENCOUNTER — Encounter (INDEPENDENT_AMBULATORY_CARE_PROVIDER_SITE_OTHER): Payer: Self-pay | Admitting: Orthopaedic Surgery

## 2016-11-02 ENCOUNTER — Ambulatory Visit (INDEPENDENT_AMBULATORY_CARE_PROVIDER_SITE_OTHER): Payer: Worker's Compensation | Admitting: Orthopaedic Surgery

## 2016-11-02 VITALS — BP 152/74 | HR 78 | Temp 97.4°F | Ht 65.0 in | Wt 156.0 lb

## 2016-11-02 DIAGNOSIS — S82032G Displaced transverse fracture of left patella, subsequent encounter for closed fracture with delayed healing: Secondary | ICD-10-CM

## 2016-11-02 MED ORDER — TRAMADOL HCL 50 MG PO TABS: 50.0000 mg | ORAL_TABLET | Freq: Four times a day (QID) | ORAL | 0 refills | Status: DC | PRN

## 2016-11-02 NOTE — Progress Notes (Signed)
   Post-Op Visit Note   Patient: Sharon SoxVictoria Burch           Date of Birth: 06-16-1955           MRN: [id number] Visit Date: 11/02/2016 PCP: Dorris SinghBradley, Candace, DO   Assessment & Plan: Patient returns post 10/20/2016 ORIF left patella fracture. She is 9 days postop she is in a knee immobilizer. We'll set her up for some physical therapy [place] She is here with Carolee RotaMaria Plott RN, CCM fax [phone] Percocet made her sick even with the Benadryl with itching. Prescription refill for tramadol given. Chief Complaint:  Chief Complaint  Patient presents with  . Left Knee - Routine Post Op    10/24/16 ORIF Left Patella fracture. 9 days post op.   Visit Diagnoses:  1. Closed displaced transverse fracture of left patella with delayed healing, subsequent encounter     Plan: She can get in the shower continue to work on gentle knee range of motion and flexion. She'll work on straight leg raising. And almost can lift her leg at this point with quadriceps contracture. Physical therapy prescription given. Recheck 4 weeks.  Follow-Up Instructions: No Follow-up on file.   Orders:  Orders Placed This Encounter  Procedures  . XR Knee 1-2 Views Left   No orders of the defined types were placed in this encounter.   Imaging: No results found.  PMFS History: Patient Active Problem List   Diagnosis Date Noted  . Left patella fracture 10/24/2016   Past Medical History:  Diagnosis Date  . Arthritis    hips- bilateral  . Diabetes mellitus without complication (HCC)   . History of hiatal hernia   . History of kidney stones    passed spont.   . Hypertension     History reviewed. No pertinent family history.  Past Surgical History:  Procedure Laterality Date  . ABDOMINAL HYSTERECTOMY    . APPENDECTOMY    . ORIF PATELLA Left 10/24/2016   Procedure: OPEN REDUCTION INTERNAL (ORIF) FIXATION LEFT PATELLA;  Surgeon: Eldred MangesYates, Mark C, MD;  Location: [facility] OR;  Service: Orthopedics;   Laterality: Left;  . UTERINE FIBROID SURGERY     2x- surgery for fibroids   Social History   Occupational History  . Not on file.   Social History Main Topics  . Smoking status: Never Smoker  . Smokeless tobacco: Never Used  . Alcohol use No  . Drug use: No  . Sexual activity: Not on file

## 2016-11-03 ENCOUNTER — Telehealth (INDEPENDENT_AMBULATORY_CARE_PROVIDER_SITE_OTHER): Payer: Self-pay

## 2016-11-03 NOTE — Telephone Encounter (Signed)
Received email from case mgr stating PT has been set up @ [facility] and the 1st appt is 11/07/16 ([email or web address])

## 2016-11-29 ENCOUNTER — Ambulatory Visit (INDEPENDENT_AMBULATORY_CARE_PROVIDER_SITE_OTHER): Payer: Worker's Compensation

## 2016-11-29 ENCOUNTER — Ambulatory Visit (INDEPENDENT_AMBULATORY_CARE_PROVIDER_SITE_OTHER): Payer: Worker's Compensation | Admitting: Orthopaedic Surgery

## 2016-11-29 ENCOUNTER — Encounter (INDEPENDENT_AMBULATORY_CARE_PROVIDER_SITE_OTHER): Payer: Self-pay | Admitting: Orthopaedic Surgery

## 2016-11-29 VITALS — BP 169/81 | HR 76

## 2016-11-29 DIAGNOSIS — S82032G Displaced transverse fracture of left patella, subsequent encounter for closed fracture with delayed healing: Secondary | ICD-10-CM | POA: Diagnosis not present

## 2016-11-29 NOTE — Progress Notes (Signed)
   Post-Op Visit Note   Patient: Sharon Leonard           Date of Birth: [birth year]           MRN: [id number] Visit Date: 11/29/2016 PCP: Dorris Singh, DO   Assessment & Plan: 1 month post ORIF left patella fracture. She is using ibuprofen for pain.  Chief Complaint:  Chief Complaint  Patient presents with  . Left Knee - Routine Post Op   Visit Diagnoses:  1. Closed displaced transverse fracture of left patella with delayed healing, subsequent encounter     Plan: Patient is making good progress physical therapy. Her case manager Carolee Rota RN, CCM fax #[phone] is available today. She states that they have light duty available. Patient could do a sit down job. She need use her crutches when she gets to the bathroom. She needs to be able to make her therapy visits so she can continue to work on quad strengthening with the goal of getting back to normal work activity which she should be able to do hopefully in about 4 weeks estimated. Work slip given for sitdown work. Recheck 4 weeks to check her progress.  Follow-Up Instructions: No Follow-up on file.   Orders:  Orders Placed This Encounter  Procedures  . XR Knee 1-2 Views Left   No orders of the defined types were placed in this encounter.   Imaging: Xr Knee 1-2 Views Left  Result Date: 11/29/2016 AP and lateral left knee x-rays obtained. This shows 2 screw fixation of transverse patella fracture in satisfactory position with interval healing. Impression: Satisfactory postop ORIF left patella transverse fracture. She is in near-anatomic position with interval healing on radiographs.   PMFS History: Patient Active Problem List   Diagnosis Date Noted  . Left patella fracture 10/24/2016   Past Medical History:  Diagnosis Date  . Arthritis    hips- bilateral  . Diabetes mellitus without complication (HCC)   . History of hiatal hernia   . History of kidney stones    passed spont.   . Hypertension     No family  history on file.  Past Surgical History:  Procedure Laterality Date  . ABDOMINAL HYSTERECTOMY    . APPENDECTOMY    . ORIF PATELLA Left 10/24/2016   Procedure: OPEN REDUCTION INTERNAL (ORIF) FIXATION LEFT PATELLA;  Surgeon: [name], MD;  Location: [facility] OR;  Service: Orthopedics;  Laterality: Left;  . UTERINE FIBROID SURGERY     2x- surgery for fibroids   Social History   Occupational History  . Not on file.   Social History Main Topics  . Smoking status: Never Smoker  . Smokeless tobacco: Never Used  . Alcohol use No  . Drug use: No  . Sexual activity: Not on file

## 2016-12-14 ENCOUNTER — Ambulatory Visit (INDEPENDENT_AMBULATORY_CARE_PROVIDER_SITE_OTHER): Payer: Worker's Compensation | Admitting: Surgery

## 2016-12-14 ENCOUNTER — Encounter (INDEPENDENT_AMBULATORY_CARE_PROVIDER_SITE_OTHER): Payer: Self-pay | Admitting: Surgery

## 2016-12-14 DIAGNOSIS — S82032G Displaced transverse fracture of left patella, subsequent encounter for closed fracture with delayed healing: Secondary | ICD-10-CM

## 2016-12-14 NOTE — Patient Instructions (Signed)
Continue physical therapy to work on knee range of motion and strengthening.

## 2016-12-14 NOTE — Progress Notes (Signed)
   Post-Op Visit Note   Patient: Sharon Leonard           Date of Birth: Dec 19, 1955           MRN: [id number] Visit Date: 12/14/2016 PCP: Dorris SinghBradley, Candace, DO   Assessment & Plan:  Chief Complaint:  Chief Complaint  Patient presents with  . Left Knee - Pain   Patient returns. Status post ORIF patella fracture 10/24/2016. She can continue to work with physical therapy. 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 Patient has some concerns about a small area of rash around her knee for about the last 2-3 weeks. Comes and goes. Patient has been wearing a store-bought knee sleeve. Patient also complaining of some pain on the medial lateral joint line. She has not had an MRI of the knee since her injury. Patient also states that her surgical scar is pink has been scratching and rubbing it.     Visit Diagnoses:  1. Closed displaced transverse fracture of left patella with delayed healing, subsequent encounter   Contact dermatitis  Plan: Patient will continue formal PT to improve knee range of motion and strengthening. She is complaining of pain along the medial lateral joint line. If this continues to be an issue at return office visit in 4 weeks and may consider scheduling a MRI to rule out meniscal tear and ligament injury related to the work-related trauma. Patient appears to have been having some issue with contact dermatitis and I believe is related to the store knee sleeve that she has. We did give her a new knee sleeve from the office today to try. Her knee looks good today. If she has some irritation on her skin she can use over-the-counter Benadryl cream to see if this helps. For her surgical scar I recommend that she get Mederma over-the-counter and apply to the scar as directed.  Continue current light duty restrictions.  Follow-Up Instructions: Return in about 4 weeks (around 01/11/2017) for With Dr. Ophelia CharterYates.   Orders:  No orders of the defined types were placed in this  encounter.  No orders of the defined types were placed in this encounter.   Imaging: No results found.  PMFS History: Patient Active Problem List   Diagnosis Date Noted  . Left patella fracture 10/24/2016   Past Medical History:  Diagnosis Date  . Arthritis    hips- bilateral  . Diabetes mellitus without complication (HCC)   . History of hiatal hernia   . History of kidney stones    passed spont.   . Hypertension     No family history on file.  Past Surgical History:  Procedure Laterality Date  . ABDOMINAL HYSTERECTOMY    . APPENDECTOMY    . ORIF PATELLA Left 10/24/2016   Procedure: OPEN REDUCTION INTERNAL (ORIF) FIXATION LEFT PATELLA;  Surgeon: Eldred MangesYates, Mark C, MD;  Location: [facility] OR;  Service: Orthopedics;  Laterality: Left;  . UTERINE FIBROID SURGERY     2x- surgery for fibroids   Social History   Occupational History  . Not on file.   Social History Main Topics  . Smoking status: Never Smoker  . Smokeless tobacco: Never Used  . Alcohol use No  . Drug use: No  . Sexual activity: Not on file

## 2016-12-30 ENCOUNTER — Ambulatory Visit (INDEPENDENT_AMBULATORY_CARE_PROVIDER_SITE_OTHER): Payer: Self-pay | Admitting: Orthopaedic Surgery

## 2017-01-16 ENCOUNTER — Telehealth (INDEPENDENT_AMBULATORY_CARE_PROVIDER_SITE_OTHER): Payer: Self-pay | Admitting: Orthopaedic Surgery

## 2017-01-16 NOTE — Telephone Encounter (Signed)
Karie KirksJulie Shahan with [employer] called needing to know when will the patient be released to return to work full time. The number to contact Raynelle FanningJulie is [phone]

## 2017-01-16 NOTE — Telephone Encounter (Signed)
Patient has follow up appt in office tomorrow.

## 2017-01-17 ENCOUNTER — Ambulatory Visit (INDEPENDENT_AMBULATORY_CARE_PROVIDER_SITE_OTHER): Payer: Worker's Compensation

## 2017-01-17 ENCOUNTER — Encounter (HOSPITAL_BASED_OUTPATIENT_CLINIC_OR_DEPARTMENT_OTHER): Payer: Self-pay | Admitting: *Deleted

## 2017-01-17 ENCOUNTER — Ambulatory Visit (INDEPENDENT_AMBULATORY_CARE_PROVIDER_SITE_OTHER): Payer: Worker's Compensation | Admitting: Orthopaedic Surgery

## 2017-01-17 ENCOUNTER — Other Ambulatory Visit: Payer: Self-pay

## 2017-01-17 ENCOUNTER — Encounter (INDEPENDENT_AMBULATORY_CARE_PROVIDER_SITE_OTHER): Payer: Self-pay | Admitting: Orthopaedic Surgery

## 2017-01-17 ENCOUNTER — Emergency Department (HOSPITAL_BASED_OUTPATIENT_CLINIC_OR_DEPARTMENT_OTHER)
Admission: EM | Admit: 2017-01-17 | Discharge: 2017-01-17 | Disposition: A | Discharge status: Home / Self Care | Payer: 59 | Attending: Emergency Medicine | Admitting: Emergency Medicine

## 2017-01-17 VITALS — BP 138/80 | HR 81 | Ht 64.5 in | Wt 157.0 lb

## 2017-01-17 DIAGNOSIS — S82032G Displaced transverse fracture of left patella, subsequent encounter for closed fracture with delayed healing: Secondary | ICD-10-CM

## 2017-01-17 DIAGNOSIS — Z7984 Long term (current) use of oral hypoglycemic drugs: Secondary | ICD-10-CM | POA: Insufficient documentation

## 2017-01-17 DIAGNOSIS — R110 Nausea: Secondary | ICD-10-CM | POA: Diagnosis not present

## 2017-01-17 DIAGNOSIS — Z91040 Latex allergy status: Secondary | ICD-10-CM | POA: Insufficient documentation

## 2017-01-17 DIAGNOSIS — R739 Hyperglycemia, unspecified: Secondary | ICD-10-CM | POA: Diagnosis present

## 2017-01-17 DIAGNOSIS — I10 Essential (primary) hypertension: Secondary | ICD-10-CM | POA: Diagnosis not present

## 2017-01-17 DIAGNOSIS — Z7982 Long term (current) use of aspirin: Secondary | ICD-10-CM | POA: Diagnosis not present

## 2017-01-17 DIAGNOSIS — R51 Headache: Secondary | ICD-10-CM | POA: Insufficient documentation

## 2017-01-17 DIAGNOSIS — Z79899 Other long term (current) drug therapy: Secondary | ICD-10-CM | POA: Diagnosis not present

## 2017-01-17 DIAGNOSIS — E1165 Type 2 diabetes mellitus with hyperglycemia: Secondary | ICD-10-CM | POA: Diagnosis not present

## 2017-01-17 DIAGNOSIS — Z885 Allergy status to narcotic agent status: Secondary | ICD-10-CM | POA: Insufficient documentation

## 2017-01-17 LAB — BASIC METABOLIC PANEL
ANION GAP: 8 (ref 5–15)
BUN: 12 mg/dL (ref 6–20)
CALCIUM: 8.5 mg/dL — AB (ref 8.9–10.3)
CHLORIDE: 105 mmol/L (ref 101–111)
CO2: 22 mmol/L (ref 22–32)
Creatinine, Ser: 0.48 mg/dL (ref 0.44–1.00)
GFR calc non Af Amer: >60 mL/min (ref >60)
GLUCOSE: 178 mg/dL — AB (ref 65–99)
POTASSIUM: 3.5 mmol/L (ref 3.5–5.1)
Sodium: 135 mmol/L (ref 135–145)

## 2017-01-17 LAB — URINALYSIS, ROUTINE W REFLEX MICROSCOPIC
Bilirubin Urine: NEGATIVE
Hgb urine dipstick: NEGATIVE
Ketones, ur: NEGATIVE mg/dL
NITRITE: NEGATIVE
PH: 6 (ref 5.0–8.0)
Protein, ur: NEGATIVE mg/dL
SPECIFIC GRAVITY, URINE: 1.025 (ref 1.005–1.030)

## 2017-01-17 LAB — CBC
HEMATOCRIT: 38.2 % (ref 36.0–46.0)
HEMOGLOBIN: 13.4 g/dL (ref 12.0–15.0)
MCH: 27.8 pg (ref 26.0–34.0)
MCHC: 35.1 g/dL (ref 30.0–36.0)
MCV: 79.3 fL (ref 78.0–100.0)
Platelets: 281 10*3/uL (ref 150–400)
RBC: 4.82 MIL/uL (ref 3.87–5.11)
RDW: 12.9 % (ref 11.5–15.5)
WBC: 9 10*3/uL (ref 4.0–10.5)

## 2017-01-17 LAB — URINALYSIS, MICROSCOPIC (REFLEX)

## 2017-01-17 LAB — CBG MONITORING, ED
GLUCOSE-CAPILLARY: 206 mg/dL — AB (ref 65–99)
GLUCOSE-CAPILLARY: 166 mg/dL — AB (ref 65–99)

## 2017-01-17 MED ORDER — METOCLOPRAMIDE HCL 5 MG/ML IJ SOLN
5.0000 mg | Freq: Once | INTRAMUSCULAR | Status: AC
  Administered 2017-01-17: 5 mg via INTRAVENOUS
  Filled 2017-01-17: qty 2

## 2017-01-17 MED ORDER — SODIUM CHLORIDE 0.9 % IV BOLUS (SEPSIS)
1000.0000 mL | Freq: Once | INTRAVENOUS | Status: AC
  Administered 2017-01-17: 1000 mL via INTRAVENOUS

## 2017-01-17 NOTE — ED Notes (Signed)
ED Provider at bedside. 

## 2017-01-17 NOTE — Progress Notes (Signed)
   Post-Op Visit Note   Patient: Sharon Leonard           Date of Birth: [birth year]           MRN: [id number] Visit Date: 01/17/2017 PCP: Dorris SinghBradley, Candace, DO   Assessment & Plan: Follow-up left patella fracture ORIF 10/24/2016 for transverse fracture of the left patella that occurred on the job.  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  She is ambulating with a single crutch.  Recent treatment for bronchitis giving steroids because her blood sugar did jump up to 4-500 range.  She was on pills for diabetes but likely needs to be on insulin.  She is here with her case manager Carolee RotaMaria Plott RN, CCM.  Fax number [phone].  Therapy summary reviewed from 4 days ago showing 0-125 degrees range of motion.  Strength is improved to one half grade down functional abilities improved.  Cane fitted for use in her right hand.  Chief Complaint:  Chief Complaint  Patient presents with  . Left Knee - Routine Post Op   Visit Diagnoses:  1. Closed displaced transverse fracture of left patella with delayed healing, subsequent encounter     Plan: Patient's been using a single crutch we fitted her with a cane today that she can use.  We will ask the case manager to see if they can allow her to  Follow-Up Instructions: No Follow-up on file.   Orders:  Orders Placed This Encounter  Procedures  . XR Knee 1-2 Views Left   No orders of the defined types were placed in this encounter.   Imaging: No results found.  PMFS History: Patient Active Problem List   Diagnosis Date Noted  . Left patella fracture 10/24/2016   Past Medical History:  Diagnosis Date  . Arthritis    hips- bilateral  . Diabetes mellitus without complication (HCC)   . History of hiatal hernia   . History of kidney stones    passed spont.   . Hypertension     No family history on file.  Past Surgical History:  Procedure Laterality Date  . ABDOMINAL HYSTERECTOMY    . APPENDECTOMY    .  ORIF PATELLA Left 10/24/2016   Procedure: OPEN REDUCTION INTERNAL (ORIF) FIXATION LEFT PATELLA;  Surgeon: Eldred MangesYates, Mark C, MD;  Location: [facility] OR;  Service: Orthopedics;  Laterality: Left;  . UTERINE FIBROID SURGERY     2x- surgery for fibroids   Social History   Occupational History  . Not on file  Tobacco Use  . Smoking status: Never Smoker  . Smokeless tobacco: Never Used  Substance and Sexual Activity  . Alcohol use: No  . Drug use: No  . Sexual activity: Not on file

## 2017-01-17 NOTE — ED Triage Notes (Signed)
Hyperglycemia. She was seen at [facility] 2 weeks ago for bronchitis. She was given steroids. It messed her sugar levels up and she has not been able to get it controlled with oral control.

## 2017-01-17 NOTE — Telephone Encounter (Signed)
Case manager advised

## 2017-01-17 NOTE — Discharge Instructions (Signed)
Contact your primary care physician tomorrow to arrange a follow-up appointment.  Blood sugars are coming down nicely since you have been off of prednisone

## 2017-01-17 NOTE — ED Provider Notes (Addendum)
[facility] EMERGENCY DEPARTMENT Provider Note   CSN: [id number] Arrival date & time: 01/17/17  1657     History   Chief Complaint Chief Complaint  Patient presents with  . Hyperglycemia    HPI Sharon Leonard is a 61 y.o. female.  HPI And had been on prednisone last week for bronchitis and cough.  She stopped prednisone 4 days ago.  She reports that her blood sugars were in the 500s as of 4 days ago.  Presently complains of mild frontal headache and nausea, no vomiting, cough is much improved she denies any fever denies shortness of breath no other symptoms.  No treatment prior to coming here Past Medical History:  Diagnosis Date  . Arthritis    hips- bilateral  . Diabetes mellitus without complication (HCC)   . History of hiatal hernia   . History of kidney stones    passed spont.   . Hypertension     Patient Active Problem List   Diagnosis Date Noted  . Left patella fracture 10/24/2016    Past Surgical History:  Procedure Laterality Date  . ABDOMINAL HYSTERECTOMY    . APPENDECTOMY    . ORIF PATELLA Left 10/24/2016   Procedure: OPEN REDUCTION INTERNAL (ORIF) FIXATION LEFT PATELLA;  Surgeon: [name], MD;  Location: [facility] OR;  Service: Orthopedics;  Laterality: Left;  . UTERINE FIBROID SURGERY     2x- surgery for fibroids    OB History    No data available       Home Medications    Prior to Admission medications   Medication Sig Start Date End Date Taking? Authorizing Provider  aspirin 325 MG tablet Take 1 tablet (325 mg total) by mouth daily. 10/25/16   [name], MD  cetirizine (ZYRTEC) 10 MG tablet Take 10 mg by mouth daily as needed for allergies.     [provider]  ibuprofen (ADVIL,MOTRIN) 200 MG tablet Take 400 mg by mouth 2 (two) times daily as needed for mild pain.    [provider]  JANUMET 50-500 MG tablet TAKE 1 TABLET BY MOUTH 2 (TWO) TIMES A DAY WITH MEALS. 06/23/16   [provider]  lisinopril  (PRINIVIL,ZESTRIL) 5 MG tablet Take 5 mg by mouth daily.    [provider]  methocarbamol (ROBAXIN) 500 MG tablet Take 1 tablet (500 mg total) by mouth every 6 (six) hours as needed for muscle spasms. 10/05/16   Naida Sleight, PA-C  methocarbamol (ROBAXIN) 750 MG tablet TAKE 1 TABLET BY MOUTH EVERY 8 HRS 10/13/16   [provider]  Multiple Vitamins-Minerals (ADVANCED DIABETIC MULTIVITAMIN PO) Take 1 tablet by mouth 2 (two) times daily.    [provider]  pravastatin (PRAVACHOL) 40 MG tablet Take 40 mg by mouth daily.    [provider]  tobramycin-dexamethasone Wallene Dales) ophthalmic solution Place 1 drop into both eyes 4 (four) times daily as needed.    [provider]  traMADol (ULTRAM) 50 MG tablet Take 1 tablet (50 mg total) by mouth every 6 (six) hours as needed. Patient not taking: Reported on 01/17/2017 11/02/16   [name], MD    Family History No family history on file.  Social History Social History   Tobacco Use  . Smoking status: Never Smoker  . Smokeless tobacco: Never Used  Substance Use Topics  . Alcohol use: No  . Drug use: No     Allergies   Oxycodone; Raspberry; Strawberry extract; Sulfa antibiotics; Latex; and  Shellfish allergy   Review of Systems Review of Systems  Constitutional: Negative.   HENT: Negative.   Respiratory: Negative.   Cardiovascular: Negative.   Gastrointestinal: Positive for nausea.  Musculoskeletal: Negative.   Skin: Negative.   Allergic/Immunologic: Positive for immunocompromised state.       Diabetic  Neurological: Positive for headaches.  Psychiatric/Behavioral: Negative.   All other systems reviewed and are negative.    Physical Exam Updated Vital Signs BP (!) 146/65 (BP Location: Left Arm)   Pulse 62   Temp (!) 97.5 F (36.4 C) (Oral)   Resp 16   Ht 5' 4.5" (1.638 m)   Wt 71.2 kg (157 lb)   SpO2 97%   BMI 26.53 kg/m   Physical Exam  Constitutional: She is oriented to  person, place, and time. She appears well-developed and well-nourished.  HENT:  Head: Normocephalic and atraumatic.  Eyes: Conjunctivae are normal. Pupils are equal, round, and reactive to light.  Neck: Neck supple. No tracheal deviation present. No thyromegaly present.  Cardiovascular: Normal rate and regular rhythm.  No murmur heard. Pulmonary/Chest: Effort normal and breath sounds normal.  Abdominal: Soft. Bowel sounds are normal. She exhibits no distension. There is no tenderness.  Musculoskeletal: Normal range of motion. She exhibits no edema or tenderness.  Neurological: She is alert and oriented to person, place, and time. She displays normal reflexes. No cranial nerve deficit. Coordination normal.  Skin: Skin is warm and dry. No rash noted.  Psychiatric: She has a normal mood and affect.  Nursing note and vitals reviewed.    ED Treatments / Results  Labs (all labs ordered are listed, but only abnormal results are displayed) Labs Reviewed  URINALYSIS, ROUTINE W REFLEX MICROSCOPIC - Abnormal; Notable for the following components:      Result Value   Glucose, UA >=500 (*)    Leukocytes, UA SMALL (*)    All other components within normal limits  BASIC METABOLIC PANEL - Abnormal; Notable for the following components:   Glucose, Bld 178 (*)    Calcium 8.5 (*)    All other components within normal limits  URINALYSIS, MICROSCOPIC (REFLEX) - Abnormal; Notable for the following components:   Bacteria, UA FEW (*)    Squamous Epithelial / LPF 0-5 (*)    All other components within normal limits  CBG MONITORING, ED - Abnormal; Notable for the following components:   Glucose-Capillary 206 (*)    All other components within normal limits  CBG MONITORING, ED - Abnormal; Notable for the following components:   Glucose-Capillary 166 (*)    All other components within normal limits  CBC    EKG  EKG Interpretation None       Radiology Xr Knee 1-2 Views Left  Result Date:  01/17/2017 The lateral x-rays left patella obtained and reviewed.  This shows 2 screws with healing of the patella fracture. Impression: Healed left patella fracture.   Procedures Procedures (including critical care time)  Medications Ordered in ED Medications  sodium chloride 0.9 % bolus 1,000 mL (0 mLs Intravenous Stopped 01/17/17 1911)  sodium chloride 0.9 % bolus 1,000 mL (0 mLs Intravenous Stopped 01/17/17 2049)  metoCLOPramide (REGLAN) injection 5 mg (5 mg Intravenous Given 01/17/17 1843)    8:50 PM feels much improved after treatment with intravenous normal saline and IV Reglan.  She is asymptomatic and feels ready to go home Results for orders placed or performed during the hospital encounter of 01/17/17  CBC  Result Value Ref Range  WBC 9.0 4.0 - 10.5 K/uL   RBC 4.82 3.87 - 5.11 MIL/uL   Hemoglobin 13.4 12.0 - 15.0 g/dL   HCT 14.738.2 82.936.0 - 56.246.0 %   MCV 79.3 78.0 - 100.0 fL   MCH 27.8 26.0 - 34.0 pg   MCHC 35.1 30.0 - 36.0 g/dL   RDW 13.012.9 86.511.5 - 78.415.5 %   Platelets 281 150 - 400 K/uL  Urinalysis, Routine w reflex microscopic  Result Value Ref Range   Color, Urine YELLOW YELLOW   APPearance CLEAR CLEAR   Specific Gravity, Urine 1.025 1.005 - 1.030   pH 6.0 5.0 - 8.0   Glucose, UA >=500 (A) NEGATIVE mg/dL   Hgb urine dipstick NEGATIVE NEGATIVE   Bilirubin Urine NEGATIVE NEGATIVE   Ketones, ur NEGATIVE NEGATIVE mg/dL   Protein, ur NEGATIVE NEGATIVE mg/dL   Nitrite NEGATIVE NEGATIVE   Leukocytes, UA SMALL (A) NEGATIVE  Basic metabolic panel  Result Value Ref Range   Sodium 135 135 - 145 mmol/L   Potassium 3.5 3.5 - 5.1 mmol/L   Chloride 105 101 - 111 mmol/L   CO2 22 22 - 32 mmol/L   Glucose, Bld 178 (H) 65 - 99 mg/dL   BUN 12 6 - 20 mg/dL   Creatinine, Ser 6.960.48 0.44 - 1.00 mg/dL   Calcium 8.5 (L) 8.9 - 10.3 mg/dL   GFR calc non Af Amer >60 >60 mL/min   GFR calc Af Amer >60 >60 mL/min   Anion gap 8 5 - 15  Urinalysis, Microscopic (reflex)  Result Value Ref Range    RBC / HPF 0-5 0 - 5 RBC/hpf   WBC, UA 6-30 0 - 5 WBC/hpf   Bacteria, UA FEW (A) NONE SEEN   Squamous Epithelial / LPF 0-5 (A) NONE SEEN   Mucus PRESENT   POC CBG, ED  Result Value Ref Range   Glucose-Capillary 206 (H) 65 - 99 mg/dL  CBG monitoring, ED  Result Value Ref Range   Glucose-Capillary 166 (H) 65 - 99 mg/dL   Xr Knee 1-2 Views Left  Result Date: 01/17/2017 The lateral x-rays left patella obtained and reviewed.  This shows 2 screws with healing of the patella fracture. Impression: Healed left patella fracture.  Initial Impression / Assessment and Plan / ED Course  I have reviewed the triage vital signs and the nursing notes.  Pertinent labs & imaging results that were available during my care of the patient were reviewed by me and considered in my medical decision making (see chart for details).     8:50 PM feels much improved after treatment with intravenous normal saline and IV Reglan.  She is asymptomatic and feels ready to go home Hyperglycemia felt secondary to steroids.  I suspect her blood sugar will normalize now that she is off steroids.  Plan follow-up with PMD  Final Clinical Impressions(s) / ED Diagnoses   #1 hyperglycemia #2 nonspecific headache Final diagnoses:  None    ED Discharge Orders    None       Doug SouJacubowitz, Sahana Boyland, MD 01/17/17 2059    Doug SouJacubowitz, Keveon Amsler, MD 01/17/17 2349

## 2017-01-18 ENCOUNTER — Telehealth (INDEPENDENT_AMBULATORY_CARE_PROVIDER_SITE_OTHER): Payer: Self-pay

## 2017-01-18 NOTE — Telephone Encounter (Signed)
Emailed office and work note to case mgr Sharon HesselbachMaria Leonard ([email or web address])

## 2017-01-18 NOTE — Telephone Encounter (Signed)
-----   Message from Eldred MangesMark C Yates, MD sent at 01/17/2017  2:43 PM EST ----- Carbon copy to workers comp carrier and rehab case Production designer, theatre/television/filmmanager thank you

## 2017-01-23 ENCOUNTER — Telehealth (INDEPENDENT_AMBULATORY_CARE_PROVIDER_SITE_OTHER): Payer: Self-pay

## 2017-01-23 NOTE — Telephone Encounter (Signed)
Received following email from case mgr. Emailed her the 11/29/16 office note and op note.   Naia Ruff  Can you forward the note from 11/30/16 and the 10/24/16 operative note?  Thanks! Carolee RotaMaria Plott, RN, CCM, CDMS  Case Manager  [facility]  [address], [place], [place] [address]  Office: [phone] Toll Free: [phone]  FAX: [phone] Attn: Carolee RotaMaria Plott  [email or web address]

## 2017-01-23 NOTE — Telephone Encounter (Signed)
Received vm from short term disability co needing to know the date pt was released to full time  Phone#[phone]

## 2017-01-24 ENCOUNTER — Encounter (INDEPENDENT_AMBULATORY_CARE_PROVIDER_SITE_OTHER): Payer: Self-pay | Admitting: Radiology

## 2017-01-24 NOTE — Telephone Encounter (Signed)
IC and LMVM for her advising date 01/17/17 ok to return to limited duties.  Followup is 03/07/17 and will advise further then.

## 2017-03-07 ENCOUNTER — Ambulatory Visit (INDEPENDENT_AMBULATORY_CARE_PROVIDER_SITE_OTHER): Payer: Worker's Compensation

## 2017-03-07 ENCOUNTER — Ambulatory Visit (INDEPENDENT_AMBULATORY_CARE_PROVIDER_SITE_OTHER): Payer: Worker's Compensation | Admitting: Orthopaedic Surgery

## 2017-03-07 ENCOUNTER — Encounter (INDEPENDENT_AMBULATORY_CARE_PROVIDER_SITE_OTHER): Payer: Self-pay | Admitting: Orthopaedic Surgery

## 2017-03-07 VITALS — BP 160/78 | HR 79 | Ht 64.5 in | Wt 157.0 lb

## 2017-03-07 DIAGNOSIS — S82032G Displaced transverse fracture of left patella, subsequent encounter for closed fracture with delayed healing: Secondary | ICD-10-CM

## 2017-03-07 NOTE — Progress Notes (Signed)
Office Visit Note   Patient: Sharon Leonard           Date of Birth: 1955-09-18           MRN: [id number] Visit Date: 03/07/2017              Requested by: Dorris SinghBradley, Candace, DO [address] [facility] [place], [place] [address] PCP: Dorris SinghBradley, Candace, DO   Assessment & Plan: Visit Diagnoses:  1. Closed displaced transverse fracture of left patella with delayed healing, subsequent encounter     Plan: Patient can ambulate without her cane in the office today.  She can straight leg raise and has an estimated 60% quad strength based on some test done here in the office.  She has to go up steps right leg first 1 foot at a time.  Incision is well-healed.  She can progress to work hardening.  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  She is here with Carolee RotaMaria Plott RN CCM.  Prescription for work hardening given.  She will do best if she works on terminal arc work the last 20 degrees.  Prescription given for work conditioning.  Prescription given for continued modified work.  I plan to recheck her in 2 months.  We should be able to sign an impairment rating at that point and hopefully she will be ready for regular work.  Follow-Up Instructions: Return in about 2 months (around 05/05/2017).   Orders:  Orders Placed This Encounter  Procedures  . XR Knee 1-2 Views Left   No orders of the defined types were placed in this encounter.     Procedures: No procedures performed   Clinical Data: No additional findings.   Subjective: Chief Complaint  Patient presents with  . Left Knee - Follow-up    HPI 62-year-old female returns post ORIF left patella 10/24/2016.  Scar is well-healed.  She is here with her case Production designer, theatre/television/filmmanager.  Review of Systems unchanged from surgery.   Objective: Vital Signs: BP (!) 160/78   Pulse 79   Ht 5' 4.5" (1.638 m)   Wt 157 lb (71.2 kg)   BMI 26.53 kg/m   Physical Exam  Constitutional: She is  oriented to person, place, and time. She appears well-developed.  HENT:  Head: Normocephalic.  Right Ear: External ear normal.  Left Ear: External ear normal.  Eyes: Pupils are equal, round, and reactive to light.  Neck: No tracheal deviation present. No thyromegaly present.  Cardiovascular: Normal rate.  Pulmonary/Chest: Effort normal.  Abdominal: Soft.  Neurological: She is alert and oriented to person, place, and time.  Skin: Skin is warm and dry.  Psychiatric: She has a normal mood and affect. Her behavior is normal.    Ortho Exam well-healed scar screws are not prominent.  She can do a straight leg raise.  She takes some hand resistance pressure.  She cannot do a single step step raise with her left foot yet.  No pain with hip range of motion.  No calf tenderness no venous engorgement.  Specialty Comments:  No specialty comments available.  Imaging: Xr Knee 1-2 Views Left  Result Date: 03/07/2017 2 view x-rays AP and lateral left knee obtained and reviewed.  This shows 2 screws present post ORIF of left patella with complete healing. Impression: Healed patellar fracture.  2 screws are in position without evidence of loosening.  Fracture is healed.    PMFS History:  Patient Active Problem List   Diagnosis Date Noted  . Left patella fracture 10/24/2016   Past Medical History:  Diagnosis Date  . Arthritis    hips- bilateral  . Diabetes mellitus without complication (HCC)   . History of hiatal hernia   . History of kidney stones    passed spont.   . Hypertension     No family history on file.  Past Surgical History:  Procedure Laterality Date  . ABDOMINAL HYSTERECTOMY    . APPENDECTOMY    . ORIF PATELLA Left 10/24/2016   Procedure: OPEN REDUCTION INTERNAL (ORIF) FIXATION LEFT PATELLA;  Surgeon: [name], MD;  Location: [facility] OR;  Service: Orthopedics;  Laterality: Left;  . UTERINE FIBROID SURGERY     2x- surgery for fibroids   Social History   Occupational  History  . Not on file  Tobacco Use  . Smoking status: Never Smoker  . Smokeless tobacco: Never Used  Substance and Sexual Activity  . Alcohol use: No  . Drug use: No  . Sexual activity: Not on file

## 2017-03-14 ENCOUNTER — Telehealth (INDEPENDENT_AMBULATORY_CARE_PROVIDER_SITE_OTHER): Payer: Self-pay

## 2017-03-14 NOTE — Telephone Encounter (Signed)
Faxed office note to [facility] Plott [phone]

## 2017-03-14 NOTE — Telephone Encounter (Signed)
-----   Message from Eldred MangesMark C Yates, MD sent at 03/07/2017  2:06 PM EST ----- Carbon copy to [employer] and Carolee RotaMaria Plott fax [phone]

## 2017-05-09 ENCOUNTER — Ambulatory Visit (INDEPENDENT_AMBULATORY_CARE_PROVIDER_SITE_OTHER): Payer: Worker's Compensation | Admitting: Orthopaedic Surgery

## 2017-05-09 ENCOUNTER — Encounter (INDEPENDENT_AMBULATORY_CARE_PROVIDER_SITE_OTHER): Payer: Self-pay | Admitting: Orthopaedic Surgery

## 2017-05-09 VITALS — BP 156/74 | HR 74 | Ht 64.0 in | Wt 164.0 lb

## 2017-05-09 DIAGNOSIS — Z8781 Personal history of (healed) traumatic fracture: Secondary | ICD-10-CM | POA: Diagnosis not present

## 2017-05-09 NOTE — Progress Notes (Signed)
Office Visit Note   Patient: Sharon Leonard           Date of Birth: 12-11-1955           MRN: [id number] Visit Date: 05/09/2017              Requested by: Dorris Singh, DO No address on file PCP: Dorris Singh, DO   Assessment & Plan: Visit Diagnoses:  1. History of patellar fracture     Plan: We reviewed the last x-rays with patient which shows complete healing.  She has continued to be in therapy for several months.  Work slip given for work resumption for regular work activity on Monday, May 15, 2017 without restrictions.  Case manager Sanjuana Letters, MS, CRC was present today for the discussion, review of x-rays and impairment rating. Her  Fax [phone] Impairment rating for surgery on a lower extremity joint with ORIF left patella with 2 screw fixation is 10% of the left leg.  Patient is at [facility].  She is released from care and can follow-up as needed Follow-Up Instructions: No Follow-up on file.   Orders:  No orders of the defined types were placed in this encounter.  No orders of the defined types were placed in this encounter.     Procedures: No procedures performed   Clinical Data: No additional findings.   Subjective: Chief Complaint  Patient presents with  . Left Knee - Fracture, Follow-up    HPI 62-year-old female returns post on-the-job injury with left transverse patellar fracture treated with ORIF.  She is been on modified duty and is been working in therapy making excellent progress.  She is occasionally use some ibuprofen.  X-rays showed complete healing and fracture fixation date was 10/24/2016.  Last x-rays were in January.  She has a rehab nurse present with her today  Review of Systems updated and unchanged from last office visit.  She remains on modified duty.   Objective: Vital Signs: BP (!) 156/74   Pulse 74   Ht 5\' 4"  (1.626 m)   Wt 164 lb (74.4 kg)   BMI 28.15 kg/m   Physical Exam  Constitutional: She is oriented to person,  place, and time. She appears well-developed.  HENT:  Head: Normocephalic.  Right Ear: External ear normal.  Left Ear: External ear normal.  Eyes: Pupils are equal, round, and reactive to light.  Neck: No tracheal deviation present. No thyromegaly present.  Cardiovascular: Normal rate.  Pulmonary/Chest: Effort normal.  Abdominal: Soft.  Neurological: She is alert and oriented to person, place, and time.  Skin: Skin is warm and dry.  Psychiatric: She has a normal mood and affect. Her behavior is normal.    Ortho Exam patient has full extension she flexes 130 degrees no knee effusion.  Midline incision is well-healed.  No crepitus is noted with knee extension against resistance right or left knee.  Distal pulses are intact calf is soft no pain with hip range of motion.  Specialty Comments:  No specialty comments available.  Imaging: No results found.   PMFS History: Patient Active Problem List   Diagnosis Date Noted  . History of patellar fracture 05/09/2017   Past Medical History:  Diagnosis Date  . Arthritis    hips- bilateral  . Diabetes mellitus without complication (HCC)   . History of hiatal hernia   . History of kidney stones    passed spont.   . Hypertension     No family history on file.  Past Surgical History:  Procedure Laterality Date  . ABDOMINAL HYSTERECTOMY    . APPENDECTOMY    . ORIF PATELLA Left 10/24/2016   Procedure: OPEN REDUCTION INTERNAL (ORIF) FIXATION LEFT PATELLA;  Surgeon: Eldred MangesYates, Lotoya Casella C, MD;  Location: [facility] OR;  Service: Orthopedics;  Laterality: Left;  . UTERINE FIBROID SURGERY     2x- surgery for fibroids   Social History   Occupational History  . Not on file  Tobacco Use  . Smoking status: Never Smoker  . Smokeless tobacco: Never Used  Substance and Sexual Activity  . Alcohol use: No  . Drug use: No  . Sexual activity: Not on file

## 2017-05-11 ENCOUNTER — Telehealth (INDEPENDENT_AMBULATORY_CARE_PROVIDER_SITE_OTHER): Payer: Self-pay

## 2017-05-11 NOTE — Telephone Encounter (Signed)
-----   Message from Eldred MangesMark C Yates, MD sent at 05/09/2017  1:34 PM EDT ----- Copy to Worker's Comp. and Ms. Yetta BarreJones fax number and dictation.  Thank you

## 2017-05-11 NOTE — Telephone Encounter (Signed)
Faxed the 05/09/17 office note to case mgr Carolee RotaMaria Plott [phone]

## 2017-05-25 ENCOUNTER — Other Ambulatory Visit: Payer: Self-pay

## 2017-05-25 ENCOUNTER — Emergency Department (HOSPITAL_BASED_OUTPATIENT_CLINIC_OR_DEPARTMENT_OTHER)
Admission: EM | Admit: 2017-05-25 | Discharge: 2017-05-25 | Disposition: A | Discharge status: Home / Self Care | Payer: 59 | Attending: Emergency Medicine | Admitting: Emergency Medicine

## 2017-05-25 ENCOUNTER — Encounter (HOSPITAL_BASED_OUTPATIENT_CLINIC_OR_DEPARTMENT_OTHER): Payer: Self-pay | Admitting: *Deleted

## 2017-05-25 DIAGNOSIS — Y939 Activity, unspecified: Secondary | ICD-10-CM | POA: Insufficient documentation

## 2017-05-25 DIAGNOSIS — E119 Type 2 diabetes mellitus without complications: Secondary | ICD-10-CM | POA: Insufficient documentation

## 2017-05-25 DIAGNOSIS — Z7984 Long term (current) use of oral hypoglycemic drugs: Secondary | ICD-10-CM | POA: Insufficient documentation

## 2017-05-25 DIAGNOSIS — I10 Essential (primary) hypertension: Secondary | ICD-10-CM | POA: Insufficient documentation

## 2017-05-25 DIAGNOSIS — S50861A Insect bite (nonvenomous) of right forearm, initial encounter: Secondary | ICD-10-CM | POA: Diagnosis present

## 2017-05-25 DIAGNOSIS — Z79899 Other long term (current) drug therapy: Secondary | ICD-10-CM | POA: Insufficient documentation

## 2017-05-25 DIAGNOSIS — L0390 Cellulitis, unspecified: Secondary | ICD-10-CM

## 2017-05-25 DIAGNOSIS — W57XXXA Bitten or stung by nonvenomous insect and other nonvenomous arthropods, initial encounter: Secondary | ICD-10-CM | POA: Insufficient documentation

## 2017-05-25 DIAGNOSIS — Y929 Unspecified place or not applicable: Secondary | ICD-10-CM | POA: Insufficient documentation

## 2017-05-25 DIAGNOSIS — Z7982 Long term (current) use of aspirin: Secondary | ICD-10-CM | POA: Diagnosis not present

## 2017-05-25 DIAGNOSIS — Y998 Other external cause status: Secondary | ICD-10-CM | POA: Diagnosis not present

## 2017-05-25 DIAGNOSIS — Z91040 Latex allergy status: Secondary | ICD-10-CM | POA: Diagnosis not present

## 2017-05-25 DIAGNOSIS — L03113 Cellulitis of right upper limb: Secondary | ICD-10-CM | POA: Diagnosis not present

## 2017-05-25 MED ORDER — DOXYCYCLINE HYCLATE 100 MG PO TABS
100.0000 mg | ORAL_TABLET | Freq: Once | ORAL | Status: AC
  Administered 2017-05-25: 100 mg via ORAL
  Filled 2017-05-25: qty 1

## 2017-05-25 MED ORDER — DOXYCYCLINE HYCLATE 100 MG PO CAPS: 100.0000 mg | ORAL_CAPSULE | Freq: Two times a day (BID) | ORAL | 0 refills | Status: AC

## 2017-05-25 MED ORDER — HYDROCORTISONE 1 % EX CREA: TOPICAL_CREAM | CUTANEOUS | 0 refills | Status: DC

## 2017-05-25 NOTE — ED Triage Notes (Signed)
Pt was bit by an insect on Wednesday morning. Arm is very red and swollen.

## 2017-05-25 NOTE — Discharge Instructions (Addendum)
Today you were diagnosed with cellulitis of your arm.  There was a line drawn around the permit of your arm to show where the redness is.  If the redness extends beyond this line you will need to return to the emergency department immediately.  You will need to return to follow-up with either your primary care doctor, urgent care, or the emergency department within 24-36 hours for a recheck of your skin.  You were given a prescription for antibiotics. Please take the antibiotic prescription fully.  You are also given a prescription for had his cortisone cream which you can use over the areas that are itchy. You may also take claritin or benadryl to help with the itching. Do not drive, work, or operate machinery while taking benadryl.   Please return to the emergency department sooner for any fevers, body aches, nausea, vomiting, spreading of the redness past the marked lines in your arm, or any new or worsening symptoms.

## 2017-05-25 NOTE — ED Provider Notes (Signed)
[facility] EMERGENCY DEPARTMENT Provider Note   CSN: [id number] Arrival date & time: 05/25/17  1716     History   Chief Complaint Chief Complaint  Patient presents with  . Insect Bite    HPI Sharon Leonard is a 62 y.o. female.  HPI   Pt Is a 62 year old female history of diabetes and hypertension who presents the ED today complaining of an insect bite that occurred yesterday morning.  She is unaware what insect, but states that that her twice in the arm.  Since then she has had itching, swelling, redness to the arm which is spread.  She denies any fevers, body aches, nausea, vomiting, or any other associated symptoms.  There has been no drainage from the area.  She has full range of motion of her arms and hands.  No numbness, tingling, or weakness to bilateral upper extremities.  She has not tried taking anything for the pain or itching.  Past Medical History:  Diagnosis Date  . Arthritis    hips- bilateral  . Diabetes mellitus without complication (HCC)   . History of hiatal hernia   . History of kidney stones    passed spont.   . Hypertension     Patient Active Problem List   Diagnosis Date Noted  . History of patellar fracture 05/09/2017    Past Surgical History:  Procedure Laterality Date  . ABDOMINAL HYSTERECTOMY    . APPENDECTOMY    . ORIF PATELLA Left 10/24/2016   Procedure: OPEN REDUCTION INTERNAL (ORIF) FIXATION LEFT PATELLA;  Surgeon: Eldred MangesYates, Mark C, MD;  Location: [facility] OR;  Service: Orthopedics;  Laterality: Left;  . UTERINE FIBROID SURGERY     2x- surgery for fibroids     OB History   None      Home Medications    Prior to Admission medications   Medication Sig Start Date End Date Taking? Authorizing Provider  aspirin 325 MG tablet Take 1 tablet (325 mg total) by mouth daily. 10/25/16  Yes Eldred MangesYates, Mark C, MD  ibuprofen (ADVIL,MOTRIN) 200 MG tablet Take 400 mg by mouth 2 (two) times daily as needed for mild pain.   Yes [provider]  JANUMET 50-500 MG tablet TAKE 1 TABLET BY MOUTH 2 (TWO) TIMES A DAY WITH MEALS. 06/23/16  Yes [provider]  lisinopril (PRINIVIL,ZESTRIL) 5 MG tablet Take 5 mg by mouth daily.   Yes [provider]  Multiple Vitamins-Minerals (ADVANCED DIABETIC MULTIVITAMIN PO) Take 1 tablet by mouth 2 (two) times daily.   Yes [provider]  pravastatin (PRAVACHOL) 40 MG tablet Take 40 mg by mouth daily.   Yes [provider]  tobramycin-dexamethasone Wallene Dales(TOBRADEX) ophthalmic solution Place 1 drop into both eyes 4 (four) times daily as needed.   Yes [provider]  cetirizine (ZYRTEC) 10 MG tablet Take 10 mg by mouth daily as needed for allergies.     [provider]  doxycycline (VIBRAMYCIN) 100 MG capsule Take 1 capsule (100 mg total) by mouth 2 (two) times daily for 7 days. 05/25/17 06/01/17  Adriene Padula S, PA-C  hydrocortisone cream 1 % Apply to affected area 2 times daily 05/25/17   Walsie Smeltz S, PA-C  methocarbamol (ROBAXIN) 500 MG tablet Take 1 tablet (500 mg total) by mouth every 6 (six) hours as needed for muscle spasms. Patient not taking: Reported on 05/09/2017 10/05/16   Naida Sleightwens, James M, PA-C  methocarbamol (ROBAXIN) 750 MG tablet TAKE 1 TABLET BY MOUTH EVERY 8 HRS  10/13/16   [provider]  traMADol (ULTRAM) 50 MG tablet Take 1 tablet (50 mg total) by mouth every 6 (six) hours as needed. Patient not taking: Reported on 01/17/2017 11/02/16   [name], MD    Family History History reviewed. No pertinent family history.  Social History Social History   Tobacco Use  . Smoking status: Never Smoker  . Smokeless tobacco: Never Used  Substance Use Topics  . Alcohol use: No  . Drug use: No     Allergies   Oxycodone; Raspberry; Strawberry extract; Sulfa antibiotics; Latex; and Shellfish allergy   Review of Systems Review of Systems  Constitutional: Negative for chills, fatigue and fever.  Eyes: Negative for visual  disturbance.  Respiratory: Negative for shortness of breath.   Cardiovascular: Negative for chest pain.  Gastrointestinal: Negative for nausea and vomiting.  Musculoskeletal: Negative for myalgias.  Skin:       Redness, warmth, swelling, itchiness to right forearm  Neurological: Negative for weakness and numbness.     Physical Exam Updated Vital Signs BP 140/64 (BP Location: Left Arm)   Pulse 86   Temp 98 F (36.7 C) (Oral)   Resp 18   Ht 5\' 4"  (1.626 m)   Wt 72.6 kg (160 lb)   SpO2 100%   BMI 27.46 kg/m   Physical Exam  Constitutional: She appears well-developed and well-nourished. No distress.  HENT:  Head: Normocephalic and atraumatic.  Eyes: Conjunctivae are normal.  Neck: Neck supple.  Cardiovascular: Normal rate, regular rhythm, normal heart sounds and intact distal pulses.  No murmur heard. Pulmonary/Chest: Effort normal and breath sounds normal. No respiratory distress.  Abdominal: Soft. There is no tenderness.  Musculoskeletal: She exhibits no edema.  Radial pulses intact bilat. Normal and symmetric strength to BUE. Sensation intact. Brisk cap refill bilat. FROM at wrist, hand, and elbow joint.   Neurological: She is alert.  Skin: Skin is warm and dry. Capillary refill takes less than 2 seconds.  Psychiatric: She has a normal mood and affect.  Nursing note and vitals reviewed.         ED Treatments / Results  Labs (all labs ordered are listed, but only abnormal results are displayed) Labs Reviewed - No data to display  EKG None  Radiology No results found.  Procedures Procedures (including critical care time)  Medications Ordered in ED Medications  doxycycline (VIBRA-TABS) tablet 100 mg (100 mg Oral Given 05/25/17 1834)     Initial Impression / Assessment and Plan / ED Course  I have reviewed the triage vital signs and the nursing notes.  Pertinent labs & imaging results that were available during my care of the patient were reviewed by  me and considered in my medical decision making (see chart for details).   Discussed pt presentation and exam findings with Dr. Jacqulyn Bath, who agrees with the plan.   Final Clinical Impressions(s) / ED Diagnoses   Final diagnoses:  Cellulitis, unspecified cellulitis site   Pt is without risk factors for HIV; no recent use of steroids or other immunosuppressive medications; Does have a h/o DM and is on janumet. States DM it is well controlled. Pt is without gross abscess for which I&D would be possible.  Area marked and pt advised to return in 24-36 hours for recheck and to return sooner  if redness begins to streak, extends beyond the markings, and/or fever, mylagias or nausea/vomiting develop.  Pt is alert, oriented, NAD, afebrile, non tachycardic, nonseptic and nontoxic appearing.  Pt given first dose of doxycycline in the ED.  She will be be d/c on oral antibiotics with strict f/u instructions.  Questions were answered and patient appears to be reliable to follow-up within recommended time.  Understands reasons to return sooner.  ED Discharge Orders        Ordered    doxycycline (VIBRAMYCIN) 100 MG capsule  2 times daily     05/25/17 1841    hydrocortisone cream 1 %     05/25/17 1842       Don Giarrusso S, PA-C 05/25/17 2026    Maia Plan, MD 05/26/17 1239

## 2017-05-25 NOTE — ED Notes (Signed)
Redness to RFA marked with skin marker

## 2017-05-27 ENCOUNTER — Encounter (HOSPITAL_BASED_OUTPATIENT_CLINIC_OR_DEPARTMENT_OTHER): Payer: Self-pay | Admitting: Emergency Medicine

## 2017-05-27 ENCOUNTER — Other Ambulatory Visit: Payer: Self-pay

## 2017-05-27 ENCOUNTER — Emergency Department (HOSPITAL_BASED_OUTPATIENT_CLINIC_OR_DEPARTMENT_OTHER)
Admission: EM | Admit: 2017-05-27 | Discharge: 2017-05-27 | Disposition: A | Discharge status: Home / Self Care | Payer: 59 | Attending: Emergency Medicine | Admitting: Emergency Medicine

## 2017-05-27 DIAGNOSIS — Z7982 Long term (current) use of aspirin: Secondary | ICD-10-CM | POA: Insufficient documentation

## 2017-05-27 DIAGNOSIS — Z91040 Latex allergy status: Secondary | ICD-10-CM | POA: Insufficient documentation

## 2017-05-27 DIAGNOSIS — Z5189 Encounter for other specified aftercare: Secondary | ICD-10-CM | POA: Insufficient documentation

## 2017-05-27 DIAGNOSIS — I10 Essential (primary) hypertension: Secondary | ICD-10-CM | POA: Diagnosis not present

## 2017-05-27 DIAGNOSIS — L03113 Cellulitis of right upper limb: Secondary | ICD-10-CM | POA: Diagnosis not present

## 2017-05-27 DIAGNOSIS — E119 Type 2 diabetes mellitus without complications: Secondary | ICD-10-CM | POA: Insufficient documentation

## 2017-05-27 DIAGNOSIS — Z79899 Other long term (current) drug therapy: Secondary | ICD-10-CM | POA: Diagnosis not present

## 2017-05-27 DIAGNOSIS — Z7984 Long term (current) use of oral hypoglycemic drugs: Secondary | ICD-10-CM | POA: Diagnosis not present

## 2017-05-27 NOTE — Discharge Instructions (Signed)
Please continue to take your antibiotics.  If you develop fevers, chills, the redness extends outside of the area that was marked today or you have any concerns please seek additional medical evaluation.  Please make a follow-up appointment with your primary care provider.

## 2017-05-27 NOTE — ED Triage Notes (Signed)
Pt presents for re-eval of cellulitis of R arm. Pt has been taking abx as prescribed. The redness is well inside the lines that were marked on the previous visit.

## 2017-05-27 NOTE — ED Provider Notes (Signed)
[facility] EMERGENCY DEPARTMENT Provider Note   CSN: [id number] Arrival date & time: 05/27/17  1634     History   Chief Complaint Chief Complaint  Patient presents with  . Wound Check    HPI Sharon Leonard is a 62 y.o. female with a history of diabetes who presents today for a wound recheck.  She was seen here on 05/25/17 where she had redness and pain on her right forearm.  She reports that since then she has been doing well and that the area of redness has decreased.  She reports that she is tolerating the antibiotics well.  Denies fevers chills, no nausea vomiting diarrhea.  She was discharged on doxycycline and given hydrocortisone cream which she reports both of been helping.  HPI  Past Medical History:  Diagnosis Date  . Arthritis    hips- bilateral  . Diabetes mellitus without complication (HCC)   . History of hiatal hernia   . History of kidney stones    passed spont.   . Hypertension     Patient Active Problem List   Diagnosis Date Noted  . History of patellar fracture 05/09/2017    Past Surgical History:  Procedure Laterality Date  . ABDOMINAL HYSTERECTOMY    . APPENDECTOMY    . ORIF PATELLA Left 10/24/2016   Procedure: OPEN REDUCTION INTERNAL (ORIF) FIXATION LEFT PATELLA;  Surgeon: Eldred MangesYates, Mark C, MD;  Location: [facility] OR;  Service: Orthopedics;  Laterality: Left;  . UTERINE FIBROID SURGERY     2x- surgery for fibroids     OB History   None      Home Medications    Prior to Admission medications   Medication Sig Start Date End Date Taking? Authorizing Provider  aspirin 325 MG tablet Take 1 tablet (325 mg total) by mouth daily. 10/25/16   Eldred MangesYates, Mark C, MD  cetirizine (ZYRTEC) 10 MG tablet Take 10 mg by mouth daily as needed for allergies.     [provider]  doxycycline (VIBRAMYCIN) 100 MG capsule Take 1 capsule (100 mg total) by mouth 2 (two) times daily for 7 days. 05/25/17 06/01/17  Couture, Cortni S, PA-C  hydrocortisone cream 1 %  Apply to affected area 2 times daily 05/25/17   Couture, Cortni S, PA-C  ibuprofen (ADVIL,MOTRIN) 200 MG tablet Take 400 mg by mouth 2 (two) times daily as needed for mild pain.    [provider]  JANUMET 50-500 MG tablet TAKE 1 TABLET BY MOUTH 2 (TWO) TIMES A DAY WITH MEALS. 06/23/16   [provider]  lisinopril (PRINIVIL,ZESTRIL) 5 MG tablet Take 5 mg by mouth daily.    [provider]  methocarbamol (ROBAXIN) 500 MG tablet Take 1 tablet (500 mg total) by mouth every 6 (six) hours as needed for muscle spasms. Patient not taking: Reported on 05/09/2017 10/05/16   Naida Sleightwens, James M, PA-C  methocarbamol (ROBAXIN) 750 MG tablet TAKE 1 TABLET BY MOUTH EVERY 8 HRS 10/13/16   [provider]  Multiple Vitamins-Minerals (ADVANCED DIABETIC MULTIVITAMIN PO) Take 1 tablet by mouth 2 (two) times daily.    [provider]  pravastatin (PRAVACHOL) 40 MG tablet Take 40 mg by mouth daily.    [provider]  tobramycin-dexamethasone Wallene Dales(TOBRADEX) ophthalmic solution Place 1 drop into both eyes 4 (four) times daily as needed.    [provider]  traMADol (ULTRAM) 50 MG tablet Take 1 tablet (50 mg total) by mouth every 6 (six) hours as needed. Patient not taking: Reported  on 01/17/2017 11/02/16   [name], MD    Family History No family history on file.  Social History Social History   Tobacco Use  . Smoking status: Never Smoker  . Smokeless tobacco: Never Used  Substance Use Topics  . Alcohol use: No  . Drug use: No     Allergies   Oxycodone; Raspberry; Strawberry extract; Sulfa antibiotics; Latex; and Shellfish allergy   Review of Systems Review of Systems  Constitutional: Negative for chills and fever.  Skin: Positive for color change (Area of redness is decreasing) and wound.  All other systems reviewed and are negative.    Physical Exam Updated Vital Signs BP 140/61 (BP Location: Left Arm)   Pulse 78   Temp 98.8 F (37.1 C)  (Oral)   Resp 16   Ht 5\' 4"  (1.626 m)   Wt 72.6 kg (160 lb)   SpO2 100%   BMI 27.46 kg/m   Physical Exam  Constitutional: She is oriented to person, place, and time. She appears well-developed and well-nourished.  HENT:  Head: Normocephalic and atraumatic.  Cardiovascular: Intact distal pulses.  Musculoskeletal:  No obvious edema crepitus or deformity of the right arm.  Neurological: She is alert and oriented to person, place, and time.  Skin: Skin is warm and dry. She is not diaphoretic.  There is an area of redness on the right forearm.  There is no fluctuance, induration, or drainage.  The mark from 2 days ago is still present and redness is significantly decreased.  Psychiatric: She has a normal mood and affect.  Nursing note and vitals reviewed.        ED Treatments / Results  Labs (all labs ordered are listed, but only abnormal results are displayed) Labs Reviewed - No data to display  EKG None  Radiology No results found.  Procedures Procedures (including critical care time)  Medications Ordered in ED Medications - No data to display   Initial Impression / Assessment and Plan / ED Course  I have reviewed the triage vital signs and the nursing notes.  Pertinent labs & imaging results that were available during my care of the patient were reviewed by me and considered in my medical decision making (see chart for details).    Patient presents today for evaluation of a wound check.  She reports compliance with her antibiotics that she has been tolerating well.  No fevers or chills or other infectious symptoms.  Area of redness has significantly reduced, new line was drawn around the area of redness and new clinical images were obtained.  Return precautions were discussed and she states her understanding.  Discharged home with PCP follow-up.     Final Clinical Impressions(s) / ED Diagnoses   Final diagnoses:  Visit for wound check    ED Discharge Orders     None       Norman Clay 05/27/17 1755    Rolland Porter, MD 05/29/17 2356

## 2017-08-18 ENCOUNTER — Telehealth (INDEPENDENT_AMBULATORY_CARE_PROVIDER_SITE_OTHER): Payer: Self-pay

## 2017-08-18 NOTE — Telephone Encounter (Signed)
This pt is being sent for IME to see another ortho doc per her work comp case mgr. Pt needs a CD of all of her xrays to take with her. Case mgr will advise pt she can pick up at the front desk when ready.

## 2017-08-22 NOTE — Telephone Encounter (Signed)
Left message for patient that CD is ready for pickup.

## 2017-09-08 DIAGNOSIS — S82002A Unspecified fracture of left patella, initial encounter for closed fracture: Secondary | ICD-10-CM | POA: Insufficient documentation

## 2017-09-08 DIAGNOSIS — M1250 Traumatic arthropathy, unspecified site: Secondary | ICD-10-CM | POA: Insufficient documentation

## 2017-09-19 ENCOUNTER — Ambulatory Visit (INDEPENDENT_AMBULATORY_CARE_PROVIDER_SITE_OTHER): Payer: Worker's Compensation

## 2017-09-19 ENCOUNTER — Ambulatory Visit (INDEPENDENT_AMBULATORY_CARE_PROVIDER_SITE_OTHER): Payer: Worker's Compensation | Admitting: Orthopaedic Surgery

## 2017-09-19 ENCOUNTER — Encounter (INDEPENDENT_AMBULATORY_CARE_PROVIDER_SITE_OTHER): Payer: Self-pay | Admitting: Orthopaedic Surgery

## 2017-09-19 VITALS — BP 177/77 | HR 58 | Ht 64.0 in | Wt 158.0 lb

## 2017-09-19 DIAGNOSIS — G8929 Other chronic pain: Secondary | ICD-10-CM

## 2017-09-19 DIAGNOSIS — M25562 Pain in left knee: Secondary | ICD-10-CM

## 2017-09-19 NOTE — Progress Notes (Signed)
Office Visit Note   Patient: Sharon Leonard           Date of Birth: 21-Aug-1955           MRN: [id number] Visit Date: 09/19/2017              Requested by: Sharon SinghBradley, Candace, DO No address on file PCP: Sharon SinghBradley, Candace, DO   Assessment & Plan: Visit Diagnoses:  1. Chronic pain of left knee        Post ORIF left patella 10/24/2016 with residual postop quad weakness, symptomatic.  Plan: Prescription for physical therapy ordered.  She has a quad weakness problem.  If therapy does not fix her problem then we can consider Synvisc injection etc. as recommended by the IME.  IME report was reviewed today with patient rehab nurse and recommendations were discussed in detail.  No change in previous impairment rating.  I plan to recheck her in 2 months to review her progress with therapy.  Follow-Up Instructions: No follow-ups on file.   Orders:  Orders Placed This Encounter  Procedures  . XR KNEE 3 VIEW LEFT   No orders of the defined types were placed in this encounter.     Procedures: No procedures performed   Clinical Data: No additional findings.   Subjective: Chief Complaint  Patient presents with  . Left Knee - Pain    HPI 62 year old female returns post ORIF left patella fracture 10/24/2016.  She tried to do some jogging with her sister she is had problems with steps aching pain in her knee.  She is able to walk and ambulate but notes increased problems particularly with activities that involve resistive quad extension.  She did go through physical therapy.  She has not been doing any recent exercises other than normal activity of work and walking.  She is used some ibuprofen which helps some she has not noted any swelling no problems with the healed midline incision.  She had previously been placed at [facility] and rating was performed.  Patient had gone for an  IME.  He had recommended considering Visco injections.  Rehab case manager [name] is present with her today fax  number is [phone].  Review of Systems reviewed updated unchanged from last year surgery.   Objective: Vital Signs: BP (!) 177/77   Pulse (!) 58   Ht 5\' 4"  (1.626 m)   Wt 158 lb (71.7 kg)   BMI 27.12 kg/m   Physical Exam  Constitutional: She is oriented to person, place, and time. She appears well-developed.  HENT:  Head: Normocephalic.  Right Ear: External ear normal.  Left Ear: External ear normal.  Eyes: Pupils are equal, round, and reactive to light.  Neck: No tracheal deviation present. No thyromegaly present.  Cardiovascular: Normal rate.  Pulmonary/Chest: Effort normal.  Abdominal: Soft.  Neurological: She is alert and oriented to person, place, and time.  Skin: Skin is warm and dry.  Psychiatric: She has a normal mood and affect. Her behavior is normal.    Ortho Exam well-healed midline incision minimal crepitus with knee extension no effusion ACL PCL is normal.  No joint line tenderness.  Placing her purse on her opposite ankle she can easily do straight leg raising.  On the left leg she cannot pick her purse up off the floor.  Single stance step she has difficulty and has to bend at the waist put her hands on the table to hop up with residual quad weakness.  Specialty Comments:  No specialty comments available.  Imaging: No results found.   PMFS History: Patient Active Problem List   Diagnosis Date Noted  . History of patellar fracture 05/09/2017   Past Medical History:  Diagnosis Date  . Arthritis    hips- bilateral  . Diabetes mellitus without complication (HCC)   . History of hiatal hernia   . History of kidney stones    passed spont.   . Hypertension     No family history on file.  Past Surgical History:  Procedure Laterality Date  . ABDOMINAL HYSTERECTOMY    . APPENDECTOMY    . ORIF PATELLA Left 10/24/2016   Procedure: OPEN REDUCTION INTERNAL (ORIF) FIXATION LEFT PATELLA;  Surgeon: [name], MD;  Location: [facility] OR;  Service: Orthopedics;   Laterality: Left;  . UTERINE FIBROID SURGERY     2x- surgery for fibroids   Social History   Occupational History  . Not on file  Tobacco Use  . Smoking status: Never Smoker  . Smokeless tobacco: Never Used  Substance and Sexual Activity  . Alcohol use: No  . Drug use: No  . Sexual activity: Not on file

## 2017-09-19 NOTE — Addendum Note (Signed)
Addended by: Rogers SeedsYEATTS, Shigeko Manard M on: 09/19/2017 10:43 AM   Modules accepted: Orders

## 2017-11-21 ENCOUNTER — Telehealth (INDEPENDENT_AMBULATORY_CARE_PROVIDER_SITE_OTHER): Payer: Self-pay

## 2017-11-21 ENCOUNTER — Ambulatory Visit (INDEPENDENT_AMBULATORY_CARE_PROVIDER_SITE_OTHER): Payer: Worker's Compensation | Admitting: Orthopaedic Surgery

## 2017-11-21 ENCOUNTER — Encounter (INDEPENDENT_AMBULATORY_CARE_PROVIDER_SITE_OTHER): Payer: Self-pay | Admitting: Orthopaedic Surgery

## 2017-11-21 VITALS — BP 151/72 | HR 83 | Ht 64.0 in | Wt 158.0 lb

## 2017-11-21 DIAGNOSIS — Z8781 Personal history of (healed) traumatic fracture: Secondary | ICD-10-CM | POA: Diagnosis not present

## 2017-11-21 DIAGNOSIS — M659 Synovitis and tenosynovitis, unspecified: Secondary | ICD-10-CM | POA: Diagnosis not present

## 2017-11-21 MED ORDER — METHYLPREDNISOLONE ACETATE 40 MG/ML IJ SUSP
40.0000 mg | INTRAMUSCULAR | Status: AC | PRN
  Administered 2017-11-21: 40 mg via INTRA_ARTICULAR

## 2017-11-21 MED ORDER — LIDOCAINE HCL 1 % IJ SOLN
0.5000 mL | INTRAMUSCULAR | Status: AC | PRN
  Administered 2017-11-21: .5 mL

## 2017-11-21 MED ORDER — BUPIVACAINE HCL 0.25 % IJ SOLN
4.0000 mL | INTRAMUSCULAR | Status: AC | PRN
  Administered 2017-11-21: 4 mL via INTRA_ARTICULAR

## 2017-11-21 NOTE — Progress Notes (Signed)
Office Visit Note   Patient: Sharon Leonard           Date of Birth: [birth year]           MRN: [id number] Visit Date: 11/21/2017              Requested by: Dorris SinghBradley, Candace, DO No address on file PCP: Dorris SinghBradley, Candace, DO   Assessment & Plan: Visit Diagnoses:  1. Synovitis of right knee   2. History of patellar fracture     Plan: We proceed with a right knee injection performed today.  Some the exercise she was doing which include squats and lunges may have aggravated her right knee.  She has some mild crepitus with knee extension consistent with CMP.  Patient was here with rehab nurse standing in for Carolee RotaMaria Plott, RN, CCM.  Patient can return on an as-needed basis no change in previous impairment rating.  She is been back at work but is taking a little personal time off currently.  She can continue the ibuprofen as needed for the right knee.  If she develops recurrent swelling or locking she can let me know.  Patient requested an extension of her temporary handicap sticker which is applied to her for 6 additional months.  Follow-Up Instructions: Return if symptoms worsen or fail to improve.   Orders:  Orders Placed This Encounter  Procedures  . Large Joint Inj: R knee   No orders of the defined types were placed in this encounter.     Procedures: Large Joint Inj: R knee on 11/21/2017 1:22 PM Indications: pain and joint swelling Details: 22 G 1.5 in needle, anterolateral approach  Arthrogram: No  Medications: 40 mg methylPREDNISolone acetate 40 MG/ML; 0.5 mL lidocaine 1 %; 4 mL bupivacaine 0.25 % Outcome: tolerated well, no immediate complications Procedure, treatment alternatives, risks and benefits explained, specific risks discussed. Consent was given by the patient. Immediately prior to procedure a time out was called to verify the correct patient, procedure, equipment, support staff and site/side marked as required. Patient was prepped and draped in the usual sterile  fashion.       Clinical Data: No additional findings.   Subjective: Chief Complaint  Patient presents with  . Left Knee - Follow-up    10/24/16 ORIF Left Patella    HPI 62 year old female returns for follow-up post ORIF left patella fracture transverse on 10/24/2016 after an on-the-job injury with a fall and a transverse patella fracture.  She had some residual quad weakness we sent her to physical therapy where she is been doing some knee extension exercises some squats and lunges and has had increased pain now in her opposite right knee with some prominence in the popliteal region where she is having aching pain.  She is noticed some swelling in her opposite right knee.  She is had a total of about 6 physical therapy visits over the last month.  Review of Systems 14 point review of systems updated unchanged.  Patient does have diabetes and I do not have any recent A1c's in the last 6 months patient states her diabetes is doing well.   Objective: Vital Signs: BP (!) 151/72   Pulse 83   Ht 5\' 4"  (1.626 m)   Wt 158 lb (71.7 kg)   BMI 27.12 kg/m   Physical Exam  Constitutional: She is oriented to person, place, and time. She appears well-developed.  HENT:  Head: Normocephalic.  Right Ear: External ear normal.  Left Ear: External ear  normal.  Eyes: Pupils are equal, round, and reactive to light.  Neck: No tracheal deviation present. No thyromegaly present.  Cardiovascular: Normal rate.  Pulmonary/Chest: Effort normal.  Abdominal: Soft.  Neurological: She is alert and oriented to person, place, and time.  Skin: Skin is warm and dry.  Psychiatric: She has a normal mood and affect. Her behavior is normal.    Ortho Exam patient has some mild crepitus with right knee extension.  There is some prominence over the anterior portion of the patella which is symmetrical and noted on sunrise patellar x-rays previously obtained.  Small Baker's cyst palpable.  No joint line tenderness.   Some pain with patellar loading and quadriceps contracture on the right.  Left knee shows well-healed midline incision.  Collateral ligaments are stable she has full extension good flexion. Specialty Comments:  No specialty comments available.  Imaging: No results found.   PMFS History: Patient Active Problem List   Diagnosis Date Noted  . History of patellar fracture 05/09/2017   Past Medical History:  Diagnosis Date  . Arthritis    hips- bilateral  . Diabetes mellitus without complication (HCC)   . History of hiatal hernia   . History of kidney stones    passed spont.   . Hypertension     History reviewed. No pertinent family history.  Past Surgical History:  Procedure Laterality Date  . ABDOMINAL HYSTERECTOMY    . APPENDECTOMY    . ORIF PATELLA Left 10/24/2016   Procedure: OPEN REDUCTION INTERNAL (ORIF) FIXATION LEFT PATELLA;  Surgeon: [name], MD;  Location: [facility] OR;  Service: Orthopedics;  Laterality: Left;  . UTERINE FIBROID SURGERY     2x- surgery for fibroids   Social History   Occupational History  . Not on file  Tobacco Use  . Smoking status: Never Smoker  . Smokeless tobacco: Never Used  Substance and Sexual Activity  . Alcohol use: No  . Drug use: No  . Sexual activity: Not on file

## 2017-11-21 NOTE — Telephone Encounter (Signed)
-----   Message from Eldred MangesMark C Yates, MD sent at 11/21/2017  1:33 PM EDT ----- CC TO w/c AND Carolee RotaMaria Plott RN

## 2017-11-21 NOTE — Telephone Encounter (Signed)
Faxed office note to Carolee RotaMaria Plott, RN  [phone]

## 2017-11-28 ENCOUNTER — Telehealth (INDEPENDENT_AMBULATORY_CARE_PROVIDER_SITE_OTHER): Payer: Self-pay

## 2017-11-28 NOTE — Telephone Encounter (Signed)
Emailed the 11/21/17 office note to the case mgr per her request

## 2017-12-23 ENCOUNTER — Other Ambulatory Visit: Payer: Self-pay

## 2017-12-23 ENCOUNTER — Emergency Department (HOSPITAL_BASED_OUTPATIENT_CLINIC_OR_DEPARTMENT_OTHER)
Admission: EM | Admit: 2017-12-23 | Discharge: 2017-12-23 | Disposition: A | Discharge status: Home / Self Care | Payer: 59 | Attending: Emergency Medicine | Admitting: Emergency Medicine

## 2017-12-23 ENCOUNTER — Emergency Department (HOSPITAL_BASED_OUTPATIENT_CLINIC_OR_DEPARTMENT_OTHER): Payer: 59

## 2017-12-23 ENCOUNTER — Encounter (HOSPITAL_BASED_OUTPATIENT_CLINIC_OR_DEPARTMENT_OTHER): Payer: Self-pay | Admitting: Emergency Medicine

## 2017-12-23 DIAGNOSIS — M79604 Pain in right leg: Secondary | ICD-10-CM | POA: Insufficient documentation

## 2017-12-23 DIAGNOSIS — R630 Anorexia: Secondary | ICD-10-CM | POA: Insufficient documentation

## 2017-12-23 DIAGNOSIS — I10 Essential (primary) hypertension: Secondary | ICD-10-CM | POA: Insufficient documentation

## 2017-12-23 DIAGNOSIS — Z79899 Other long term (current) drug therapy: Secondary | ICD-10-CM | POA: Insufficient documentation

## 2017-12-23 DIAGNOSIS — Z7982 Long term (current) use of aspirin: Secondary | ICD-10-CM | POA: Insufficient documentation

## 2017-12-23 DIAGNOSIS — Z91040 Latex allergy status: Secondary | ICD-10-CM | POA: Insufficient documentation

## 2017-12-23 DIAGNOSIS — Z7984 Long term (current) use of oral hypoglycemic drugs: Secondary | ICD-10-CM | POA: Insufficient documentation

## 2017-12-23 DIAGNOSIS — E119 Type 2 diabetes mellitus without complications: Secondary | ICD-10-CM | POA: Insufficient documentation

## 2017-12-23 DIAGNOSIS — R110 Nausea: Secondary | ICD-10-CM | POA: Insufficient documentation

## 2017-12-23 DIAGNOSIS — R0789 Other chest pain: Secondary | ICD-10-CM

## 2017-12-23 LAB — CBC
HCT: 37 % (ref 36.0–46.0)
Hemoglobin: 12.5 g/dL (ref 12.0–15.0)
MCH: 28 pg (ref 26.0–34.0)
MCHC: 33.8 g/dL (ref 30.0–36.0)
MCV: 83 fL (ref 80.0–100.0)
Platelets: 282 10*3/uL (ref 150–400)
RBC: 4.46 MIL/uL (ref 3.87–5.11)
RDW: 12.6 % (ref 11.5–15.5)
WBC: 7.2 10*3/uL (ref 4.0–10.5)
nRBC: 0 % (ref 0.0–0.2)

## 2017-12-23 LAB — BASIC METABOLIC PANEL
Anion gap: 8 (ref 5–15)
BUN: 16 mg/dL (ref 8–23)
CALCIUM: 9.5 mg/dL (ref 8.9–10.3)
CO2: 27 mmol/L (ref 22–32)
CREATININE: 0.78 mg/dL (ref 0.44–1.00)
Chloride: 102 mmol/L (ref 98–111)
GFR calc Af Amer: >60 mL/min (ref >60)
GLUCOSE: 175 mg/dL — AB (ref 70–99)
Potassium: 3.9 mmol/L (ref 3.5–5.1)
SODIUM: 137 mmol/L (ref 135–145)

## 2017-12-23 LAB — TROPONIN I

## 2017-12-23 LAB — D-DIMER, QUANTITATIVE (NOT AT ARMC): D DIMER QUANT: 0.27 ug{FEU}/mL (ref 0.00–0.50)

## 2017-12-23 MED ORDER — HYDROCODONE-ACETAMINOPHEN 5-325 MG PO TABS: 1.0000 | ORAL_TABLET | ORAL | 0 refills | Status: DC | PRN

## 2017-12-23 MED ORDER — CELECOXIB 200 MG PO CAPS: 200.0000 mg | ORAL_CAPSULE | Freq: Two times a day (BID) | ORAL | 0 refills | Status: DC

## 2017-12-23 NOTE — ED Triage Notes (Signed)
Intermittent chest pain x 3 weeks.

## 2017-12-23 NOTE — ED Notes (Signed)
ED Provider at bedside. 

## 2017-12-23 NOTE — ED Provider Notes (Signed)
[facility] EMERGENCY DEPARTMENT Provider Note   CSN: [id number] Arrival date & time: 12/23/17  4098     History   Chief Complaint Chief Complaint  Patient presents with  . Chest Pain    HPI Sharon Leonard is a 62 y.o. female.  Pt presents to the ED today with CP.  Pt said cp has been intermittent and has been going on for the last 2 weeks.  She said the pain is in different parts of her chest.  She has no sob.  She does c/o lack of appetite and nausea.  The pt denies f/c.  She does c/o right leg pain.     Past Medical History:  Diagnosis Date  . Arthritis    hips- bilateral  . Diabetes mellitus without complication (HCC)   . History of hiatal hernia   . History of kidney stones    passed spont.   . Hypertension     Patient Active Problem List   Diagnosis Date Noted  . History of patellar fracture 05/09/2017    Past Surgical History:  Procedure Laterality Date  . ABDOMINAL HYSTERECTOMY    . APPENDECTOMY    . ORIF PATELLA Left 10/24/2016   Procedure: OPEN REDUCTION INTERNAL (ORIF) FIXATION LEFT PATELLA;  Surgeon: [name], MD;  Location: [facility] OR;  Service: Orthopedics;  Laterality: Left;  . UTERINE FIBROID SURGERY     2x- surgery for fibroids     OB History   None      Home Medications    Prior to Admission medications   Medication Sig Start Date End Date Taking? Authorizing Provider  aspirin 325 MG tablet Take 1 tablet (325 mg total) by mouth daily. 10/25/16   [name], MD  celecoxib (CELEBREX) 200 MG capsule Take 1 capsule (200 mg total) by mouth 2 (two) times daily. 12/23/17   Jacalyn Lefevre, MD  cetirizine (ZYRTEC) 10 MG tablet Take 10 mg by mouth daily as needed for allergies.     [provider]  hydrocortisone cream 1 % Apply to affected area 2 times daily 05/25/17   Couture, Cortni S, PA-C  ibuprofen (ADVIL,MOTRIN) 200 MG tablet Take 400 mg by mouth 2 (two) times daily as needed for mild pain.    [provider]  JANUMET 50-500 MG tablet TAKE 1 TABLET BY MOUTH 2 (TWO) TIMES A DAY WITH MEALS. 06/23/16   [provider]  lisinopril (PRINIVIL,ZESTRIL) 5 MG tablet Take 5 mg by mouth daily.    [provider]  methocarbamol (ROBAXIN) 500 MG tablet Take 1 tablet (500 mg total) by mouth every 6 (six) hours as needed for muscle spasms. Patient not taking: Reported on 05/09/2017 10/05/16   Naida Sleight, PA-C  methocarbamol (ROBAXIN) 750 MG tablet TAKE 1 TABLET BY MOUTH EVERY 8 HRS 10/13/16   [provider]  Multiple Vitamins-Minerals (ADVANCED DIABETIC MULTIVITAMIN PO) Take 1 tablet by mouth 2 (two) times daily.    [provider]  pravastatin (PRAVACHOL) 40 MG tablet Take 40 mg by mouth daily.    [provider]  tobramycin-dexamethasone Wallene Dales) ophthalmic solution Place 1 drop into both eyes 4 (four) times daily as needed.    [provider]  traMADol (ULTRAM) 50 MG tablet Take 1 tablet (50 mg total) by mouth every 6 (six) hours as needed. Patient not taking: Reported on 01/17/2017 11/02/16   [name], MD    Family History No family history on file.  Social History  Social History   Tobacco Use  . Smoking status: Never Smoker  . Smokeless tobacco: Never Used  Substance Use Topics  . Alcohol use: No  . Drug use: No     Allergies   Oxycodone; Raspberry; Strawberry extract; Sulfa antibiotics; Tramadol; Latex; and Shellfish allergy   Review of Systems Review of Systems  Cardiovascular: Positive for chest pain.  Gastrointestinal: Positive for nausea.  All other systems reviewed and are negative.    Physical Exam Updated Vital Signs BP 129/69   Pulse 71   Temp 97.9 F (36.6 C) (Oral)   Resp 16   Ht 5\' 4"  (1.626 m)   Wt 72.6 kg   SpO2 99%   BMI 27.46 kg/m   Physical Exam  Constitutional: She is oriented to person, place, and time. She appears well-developed and well-nourished.  HENT:  Head: Normocephalic and atraumatic.    Eyes: Pupils are equal, round, and reactive to light. EOM are normal.  Neck: Normal range of motion. Neck supple.  Cardiovascular: Normal rate, regular rhythm, intact distal pulses and normal pulses.  Pulmonary/Chest: Effort normal and breath sounds normal.  Abdominal: Soft. Bowel sounds are normal.  Musculoskeletal: Normal range of motion.       Right lower leg: Normal.       Left lower leg: Normal.  Neurological: She is alert and oriented to person, place, and time.  Skin: Skin is warm and dry. Capillary refill takes less than 2 seconds.  Psychiatric: She has a normal mood and affect. Her behavior is normal.  Nursing note and vitals reviewed.    ED Treatments / Results  Labs (all labs ordered are listed, but only abnormal results are displayed) Labs Reviewed  BASIC METABOLIC PANEL - Abnormal; Notable for the following components:      Result Value   Glucose, Bld 175 (*)    All other components within normal limits  CBC  TROPONIN I  D-DIMER, QUANTITATIVE (NOT AT [facility])    EKG EKG Interpretation  Date/Time:  Saturday December 23 2017 07:41:23 EDT Ventricular Rate:  89 PR Interval:    QRS Duration: 88 QT Interval:  374 QTC Calculation: 456 R Axis:   69 Text Interpretation:  Sinus rhythm RSR' in V1 or V2, right VCD or RVH Borderline T abnormalities, anterior leads No significant change since last tracing Confirmed by Jacalyn Lefevre [phone]) on 12/23/2017 7:53:40 AM   Radiology Dg Chest 2 View  Result Date: 12/23/2017 CLINICAL DATA:  Three-week history of chest pain. EXAM: CHEST - 2 VIEW COMPARISON:  04/11/2016 FINDINGS: The examination is limited by the large cluster of wires overlying the central chest. The cardiac silhouette, mediastinal and hilar contours are within normal limits and stable. Lungs are grossly clear. No pleural effusion or pneumothorax. The bony thorax is intact. IMPRESSION: No acute cardiopulmonary findings. Electronically Signed   By: Rudie Meyer M.D.    On: 12/23/2017 08:36   US Venous Img Lower Unilateral Right  Result Date: 12/23/2017 CLINICAL DATA:  Right lower extremity pain. Recent right knee surgery. Evaluate for DVT. EXAM: RIGHT LOWER EXTREMITY VENOUS DOPPLER ULTRASOUND TECHNIQUE: Gray-scale sonography with graded compression, as well as color Doppler and duplex ultrasound were performed to evaluate the lower extremity deep venous systems from the level of the common femoral vein and including the common femoral, femoral, profunda femoral, popliteal and calf veins including the posterior tibial, peroneal and gastrocnemius veins when visible. The superficial great saphenous vein was also interrogated. Spectral Doppler was utilized to evaluate  flow at rest and with distal augmentation maneuvers in the common femoral, femoral and popliteal veins. COMPARISON:  None. FINDINGS: Contralateral Common Femoral Vein: Respiratory phasicity is normal and symmetric with the symptomatic side. No evidence of thrombus. Normal compressibility. Common Femoral Vein: No evidence of thrombus. Normal compressibility, respiratory phasicity and response to augmentation. Saphenofemoral Junction: No evidence of thrombus. Normal compressibility and flow on color Doppler imaging. Profunda Femoral Vein: No evidence of thrombus. Normal compressibility and flow on color Doppler imaging. Femoral Vein: No evidence of thrombus. Normal compressibility, respiratory phasicity and response to augmentation. Popliteal Vein: No evidence of thrombus. Normal compressibility, respiratory phasicity and response to augmentation. Calf Veins: No evidence of thrombus. Normal compressibility and flow on color Doppler imaging. Superficial Great Saphenous Vein: No evidence of thrombus. Normal compressibility. Venous Reflux:  None. Other Findings:  None. IMPRESSION: No evidence of DVT within the right lower extremity. Electronically Signed   By: Simonne Come M.D.   On: 12/23/2017 09:40     Procedures Procedures (including critical care time)  Medications Ordered in ED Medications - No data to display   Initial Impression / Assessment and Plan / ED Course  I have reviewed the triage vital signs and the nursing notes.  Pertinent labs & imaging results that were available during my care of the patient were reviewed by me and considered in my medical decision making (see chart for details).     Pt's cardiac work up is negative.  She does not have a DVT.  Ddimer is negative.  Pain has been going on for 3 weeks, so I don't think another troponin would be helpful.  PT has a heart score of 2, so I don't think she needs admission.  She is instructed to f/u with her pcp and to return if worse.  Final Clinical Impressions(s) / ED Diagnoses   Final diagnoses:  Atypical chest pain  Right leg pain    ED Discharge Orders         Ordered    celecoxib (CELEBREX) 200 MG capsule  2 times daily     12/23/17 1191           Jacalyn Lefevre, MD 12/23/17 1006

## 2018-02-16 ENCOUNTER — Telehealth (INDEPENDENT_AMBULATORY_CARE_PROVIDER_SITE_OTHER): Payer: Self-pay

## 2018-02-16 NOTE — Telephone Encounter (Signed)
Case mgr [name] called to make appointment for the patient to be seen for a one time eval for her right knee to see if related to the left knee injury. [name] will accompany pt to the appointment.

## 2018-03-07 ENCOUNTER — Telehealth (INDEPENDENT_AMBULATORY_CARE_PROVIDER_SITE_OTHER): Payer: Self-pay

## 2018-03-07 ENCOUNTER — Ambulatory Visit (INDEPENDENT_AMBULATORY_CARE_PROVIDER_SITE_OTHER): Payer: Worker's Compensation | Admitting: Orthopaedic Surgery

## 2018-03-07 ENCOUNTER — Ambulatory Visit (INDEPENDENT_AMBULATORY_CARE_PROVIDER_SITE_OTHER): Payer: Worker's Compensation

## 2018-03-07 ENCOUNTER — Encounter (INDEPENDENT_AMBULATORY_CARE_PROVIDER_SITE_OTHER): Payer: Self-pay | Admitting: Orthopaedic Surgery

## 2018-03-07 VITALS — BP 149/71 | HR 64 | Temp 97.6°F | Ht 64.0 in | Wt 160.0 lb

## 2018-03-07 DIAGNOSIS — M659 Synovitis and tenosynovitis, unspecified: Secondary | ICD-10-CM | POA: Diagnosis not present

## 2018-03-07 DIAGNOSIS — M2391 Unspecified internal derangement of right knee: Secondary | ICD-10-CM

## 2018-03-07 NOTE — Progress Notes (Addendum)
Office Visit Note   Patient: Sharon Leonard           Date of Birth: 13-Dec-1955           MRN: [id number] Visit Date: 03/07/2018              Requested by: Sharon Cleverly, PA [address] [place], [place] [address] PCP: Sharon Leonard, Sharon Leonard   Assessment & Plan: Visit Diagnoses:  1. Synovitis of right knee   2. Knee locking, right     Plan: Patient is concerned she is having ongoing problems with her knees and states this was not like this before she fell and broke her patella.  Recommend proceeding with an MRI scan of her right knee with her history of locking.  We will seek Worker's Comp. approval for this and we can determine if there is anything that may possibly be related to her on-the-job injury that occurred in 2018.  Office follow-up after scan.  With her locking she may have some medial meniscal tearing.  This should be well visualized on MRI scan.  Follow-Up Instructions: No follow-ups on file.   Orders:  Orders Placed This Encounter  Procedures  . XR Knee 1-2 Views Right   No orders of the defined types were placed in this encounter.     Procedures: No procedures performed   Clinical Data: No additional findings.   Subjective: Chief Complaint  Patient presents with  . Right Knee - Pain    HPI 63 year old female returns with ongoing problems with her right knee that had previous cortisone injection with improvement for a period of time and then recurrence.  She is also had problems with her left knee that had open reduction internal fixation of a transverse patella fracture with fixation.  Patient states her right knee has been locking with medial joint line pain she has pain at night.  Does not walk every day but tends to occur more when her knee is near full extension.  She noted some mild swelling in her knee.  Left patella was 10/24/2016 with ORIF. 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  I previously assign her an impairment rating but  she states the case has not been settled.  She and her sister had concerned about "spurs" on her knees which are actually noted on sunrise patella and is on the anterior surface of the patella not intra-articular degenerative spurs.  Patient denies numbness or tingling in her feet no associated back pain.  Sharon Rota, RN, CCM is present today for the discussion. Review of Systems updated and unchanged since her surgery 2018 on her left knee other than as mentioned in HPI.   Objective: Vital Signs: BP (!) 149/71 (BP Location: Left Arm, Patient Position: Sitting, Cuff Size: Normal)   Pulse 64   Temp 97.6 F (36.4 C) (Oral)   Ht 5\' 4"  (1.626 m)   Wt 160 lb (72.6 kg)   BMI 27.46 kg/m   Physical Exam Constitutional:      Appearance: She is well-developed.  HENT:     Head: Normocephalic.     Right Ear: External ear normal.     Left Ear: External ear normal.  Eyes:     Pupils: Pupils are equal, round, and reactive to light.  Neck:     Thyroid: No thyromegaly.     Trachea: No tracheal deviation.  Cardiovascular:     Rate and Rhythm: Normal rate.  Pulmonary:  Effort: Pulmonary effort is normal.  Abdominal:     Palpations: Abdomen is soft.  Skin:    General: Skin is warm and dry.  Neurological:     Mental Status: She is alert and oriented to person, place, and time.  Psychiatric:        Behavior: Behavior normal.     Ortho Exam well-healed incision left knee.  No knee effusion left.  Trace crepitus with knee flexion and extension on the left.  Also trace crepitus with flexion-extension of the right knee.  She reaches full extension flexes 130 degrees.  There is 2+ knee effusion on the right.  Tenderness along the medial joint line and posterior medial corner.  No pain with hyperextension.  Lockman test negative collateral ligaments are stable.  Distal pulses are intact negative straight leg raising 90 degrees.  Mild tenderness right posterior popliteal region without a definite  palpable Baker's cyst.  Specialty Comments:  No specialty comments available.  Imaging: Xr Knee 1-2 Views Right  Result Date: 03/07/2018 Standing AP both knees lateral right knee and sunrise patellar x-ray both knees were obtained and reviewed.  This shows previous to screw fixation of transverse patella fracture with satisfactory healing.  Significant degenerative changes medial lateral compartment either knee.  Small fluid suprapatellar pouch noted on the right knee none on the left.  Some enthesopathy noted over the anterior surface of both patella on sunrise view without patellofemoral degenerative spurring. Impression: Post-ORIF left patella with healing.  Small effusion noted right knee without acute changes right knee.    PMFS History: Patient Active Problem List   Diagnosis Date Noted  . History of patellar fracture 05/09/2017   Past Medical History:  Diagnosis Date  . Arthritis    hips- bilateral  . Diabetes mellitus without complication (HCC)   . History of hiatal hernia   . History of kidney stones    passed spont.   . Hypertension     No family history on file.  Past Surgical History:  Procedure Laterality Date  . ABDOMINAL HYSTERECTOMY    . APPENDECTOMY    . ORIF PATELLA Left 10/24/2016   Procedure: OPEN REDUCTION INTERNAL (ORIF) FIXATION LEFT PATELLA;  Surgeon: Eldred MangesYates, Keanon Bevins C, MD;  Location: [facility] OR;  Service: Orthopedics;  Laterality: Left;  . UTERINE FIBROID SURGERY     2x- surgery for fibroids   Social History   Occupational History  . Not on file  Tobacco Use  . Smoking status: Never Smoker  . Smokeless tobacco: Never Used  Substance and Sexual Activity  . Alcohol use: No  . Drug use: No  . Sexual activity: Not on file

## 2018-03-07 NOTE — Telephone Encounter (Signed)
Faxed todays office note to work comp case [employer] @ [phone]

## 2018-03-07 NOTE — Telephone Encounter (Signed)
-----   Message from Mark C Yates, MD sent at 03/07/2018 12:36 PM EST -----  copy to Worker's Comp. please 

## 2018-03-08 ENCOUNTER — Telehealth (INDEPENDENT_AMBULATORY_CARE_PROVIDER_SITE_OTHER): Payer: Self-pay

## 2018-03-08 NOTE — Telephone Encounter (Signed)
-----   Message from Eldred MangesMark C Yates, MD sent at 03/07/2018 12:36 PM EST -----  copy to Worker's Comp. please

## 2018-03-08 NOTE — Telephone Encounter (Signed)
Emailed note to [facility] ([email or web address])

## 2018-06-07 DIAGNOSIS — M179 Osteoarthritis of knee, unspecified: Secondary | ICD-10-CM | POA: Insufficient documentation

## 2018-06-07 DIAGNOSIS — M25561 Pain in right knee: Secondary | ICD-10-CM | POA: Insufficient documentation

## 2018-08-26 ENCOUNTER — Encounter (HOSPITAL_COMMUNITY): Payer: Self-pay

## 2018-08-26 ENCOUNTER — Other Ambulatory Visit: Payer: Self-pay

## 2018-08-26 ENCOUNTER — Emergency Department (HOSPITAL_COMMUNITY)
Admission: EM | Admit: 2018-08-26 | Discharge: 2018-08-26 | Disposition: A | Discharge status: Home / Self Care | Payer: 59 | Attending: Emergency Medicine | Admitting: Emergency Medicine

## 2018-08-26 DIAGNOSIS — L298 Other pruritus: Secondary | ICD-10-CM | POA: Insufficient documentation

## 2018-08-26 DIAGNOSIS — Z91040 Latex allergy status: Secondary | ICD-10-CM | POA: Diagnosis not present

## 2018-08-26 DIAGNOSIS — Z7984 Long term (current) use of oral hypoglycemic drugs: Secondary | ICD-10-CM | POA: Diagnosis not present

## 2018-08-26 DIAGNOSIS — Z79899 Other long term (current) drug therapy: Secondary | ICD-10-CM | POA: Insufficient documentation

## 2018-08-26 DIAGNOSIS — I10 Essential (primary) hypertension: Secondary | ICD-10-CM | POA: Insufficient documentation

## 2018-08-26 DIAGNOSIS — T783XXA Angioneurotic edema, initial encounter: Secondary | ICD-10-CM | POA: Insufficient documentation

## 2018-08-26 DIAGNOSIS — E119 Type 2 diabetes mellitus without complications: Secondary | ICD-10-CM | POA: Insufficient documentation

## 2018-08-26 DIAGNOSIS — R220 Localized swelling, mass and lump, head: Secondary | ICD-10-CM | POA: Diagnosis present

## 2018-08-26 MED ORDER — FAMOTIDINE IN NACL 20-0.9 MG/50ML-% IV SOLN
20.0000 mg | Freq: Once | INTRAVENOUS | Status: AC
  Administered 2018-08-26: 20 mg via INTRAVENOUS
  Filled 2018-08-26: qty 50

## 2018-08-26 MED ORDER — DIPHENHYDRAMINE HCL 50 MG/ML IJ SOLN
50.0000 mg | Freq: Once | INTRAMUSCULAR | Status: AC
  Administered 2018-08-26: 12:00:00 50 mg via INTRAVENOUS
  Filled 2018-08-26: qty 1

## 2018-08-26 MED ORDER — METHYLPREDNISOLONE SODIUM SUCC 125 MG IJ SOLR
125.0000 mg | Freq: Once | INTRAMUSCULAR | Status: DC
  Filled 2018-08-26: qty 2

## 2018-08-26 NOTE — Discharge Instructions (Addendum)
Stop taking your lisinopril until you recheck with your doctor. Continue taking Benadryl and Pepcid.   Return to the emergency room for any worsening or concerning symptoms.

## 2018-08-26 NOTE — ED Provider Notes (Signed)
[facility] DEPT Provider Note   CSN: [id number] Arrival date & time: 08/26/18  1122    History   Chief Complaint Chief Complaint  Patient presents with  . Oral Swelling    HPI Sharon Leonard is a 63 y.o. female.     63 year old female with history of diabetes and hypertension presents with complaint of swelling under her tongue and itching in her mouth.  Patient first noticed swelling at 11:00 this morning upon waking, states the swelling makes it difficult for her to swallow, denies any difficulty breathing.  Patient ate pecans last night before bed, no known history of allergy to nuts.  Patient takes lisinopril, last took her lisinopril 2 days ago. Denies any rashes or body itching. No other complaints or concerns.      Past Medical History:  Diagnosis Date  . Arthritis    hips- bilateral  . Diabetes mellitus without complication ([place])   . History of hiatal hernia   . History of kidney stones    passed spont.   . Hypertension     Patient Active Problem List   Diagnosis Date Noted  . Synovitis of right knee 03/07/2018  . Knee locking, right 03/07/2018  . History of patellar fracture 05/09/2017    Past Surgical History:  Procedure Laterality Date  . ABDOMINAL HYSTERECTOMY    . APPENDECTOMY    . ORIF PATELLA Left 10/24/2016   Procedure: OPEN REDUCTION INTERNAL (ORIF) FIXATION LEFT PATELLA;  Surgeon: Marybelle Killings, MD;  Location: [facility];  Service: Orthopedics;  Laterality: Left;  . UTERINE FIBROID SURGERY     2x- surgery for fibroids     OB History   No obstetric history on file.      Home Medications    Prior to Admission medications   Medication Sig Start Date End Date Taking? Authorizing Provider  aspirin 325 MG tablet Take 1 tablet (325 mg total) by mouth daily. 10/25/16   Marybelle Killings, MD  celecoxib (CELEBREX) 200 MG capsule Take 1 capsule (200 mg total) by mouth 2 (two) times daily. 12/23/17   Isla Pence, MD   cetirizine (ZYRTEC) 10 MG tablet Take 10 mg by mouth daily as needed for allergies.     [provider]  HYDROcodone-acetaminophen (NORCO/VICODIN) 5-325 MG tablet Take 1 tablet by mouth every 4 (four) hours as needed. 12/23/17   Isla Pence, MD  hydrocortisone cream 1 % Apply to affected area 2 times daily 05/25/17   Couture, Cortni S, PA-C  ibuprofen (ADVIL,MOTRIN) 200 MG tablet Take 400 mg by mouth 2 (two) times daily as needed for mild pain.    [provider]  JANUMET 50-500 MG tablet TAKE 1 TABLET BY MOUTH 2 (TWO) TIMES A DAY WITH MEALS. 06/23/16   [provider]  lisinopril (PRINIVIL,ZESTRIL) 5 MG tablet Take 5 mg by mouth daily.    [provider]  methocarbamol (ROBAXIN) 500 MG tablet Take 1 tablet (500 mg total) by mouth every 6 (six) hours as needed for muscle spasms. 10/05/16   Lanae Crumbly, PA-C  methocarbamol (ROBAXIN) 750 MG tablet TAKE 1 TABLET BY MOUTH EVERY 8 HRS 10/13/16   [provider]  Multiple Vitamins-Minerals (ADVANCED DIABETIC MULTIVITAMIN PO) Take 1 tablet by mouth 2 (two) times daily.    [provider]  pravastatin (PRAVACHOL) 40 MG tablet Take 40 mg by mouth daily.    [provider]  tobramycin-dexamethasone Baird Cancer) ophthalmic solution Place 1 drop into both eyes 4 (  four) times daily as needed.    [provider]  traMADol (ULTRAM) 50 MG tablet Take 1 tablet (50 mg total) by mouth every 6 (six) hours as needed. 11/02/16   Eldred MangesYates, Mark C, MD    Family History No family history on file.  Social History Social History   Tobacco Use  . Smoking status: Never Smoker  . Smokeless tobacco: Never Used  Substance Use Topics  . Alcohol use: No  . Drug use: No     Allergies   Oxycodone, Raspberry, Strawberry extract, Sulfa antibiotics, Tramadol, Latex, and Shellfish allergy   Review of Systems Review of Systems  Constitutional: Negative for fever.  HENT: Positive for trouble swallowing.  Negative for congestion, drooling, facial swelling and sore throat.   Respiratory: Negative for cough, chest tightness and shortness of breath.   Gastrointestinal: Negative for nausea and vomiting.  Musculoskeletal: Negative for neck pain and neck stiffness.  Skin: Negative for color change, rash and wound.  Allergic/Immunologic: Positive for immunocompromised state.  Hematological: Negative for adenopathy.  All other systems reviewed and are negative.    Physical Exam Updated Vital Signs BP (!) 159/83   Pulse 81   Temp 98.1 F (36.7 C) (Oral)   Resp 16   SpO2 100%   Physical Exam Vitals signs and nursing note reviewed.  Constitutional:      General: She is not in acute distress.    Appearance: She is well-developed. She is not diaphoretic.  HENT:     Head: Normocephalic and atraumatic.     Nose: Nose normal.     Mouth/Throat:     Mouth: Mucous membranes are moist.     Pharynx: No oropharyngeal exudate or posterior oropharyngeal erythema.     Comments: Moderate swelling to frenulum/sublingual area. Moderate submandibular fullness. No erythema. No swelling of the oropharynx. Neck:     Musculoskeletal: Neck supple.  Cardiovascular:     Rate and Rhythm: Normal rate and regular rhythm.     Pulses: Normal pulses.     Heart sounds: Normal heart sounds.  Pulmonary:     Effort: Pulmonary effort is normal.     Breath sounds: Normal breath sounds.  Skin:    General: Skin is warm and dry.     Findings: No erythema or rash.  Neurological:     Mental Status: She is alert and oriented to person, place, and time.  Psychiatric:        Behavior: Behavior normal.      ED Treatments / Results  Labs (all labs ordered are listed, but only abnormal results are displayed) Labs Reviewed - No data to display  EKG None  Radiology No results found.  Procedures Procedures (including critical care time)  Medications Ordered in ED Medications  methylPREDNISolone sodium succinate  (SOLU-MEDROL) 125 mg/2 mL injection 125 mg (0 mg Intravenous Hold 08/26/18 1159)  diphenhydrAMINE (BENADRYL) injection 50 mg (50 mg Intravenous Given 08/26/18 1157)  famotidine (PEPCID) IVPB 20 mg premix (0 mg Intravenous Stopped 08/26/18 1306)     Initial Impression / Assessment and Plan / ED Course  I have reviewed the triage vital signs and the nursing notes.  Pertinent labs & imaging results that were available during my care of the patient were reviewed by me and considered in my medical decision making (see chart for details).  Clinical Course as of Aug 26 1643  Sun Aug 26, 2018  1256 63yo female with complaint of swelling under her tongue, first noticed at 11AM  today upon waking. Reports discomfort with swallowing, no difficulty breathing.  Patient was given Bendaryl and Pepcid (declined solumedrol due to diabetes). 1PM Recheck 1 hour after meds, no change in symptoms. Patient tolerating secretions, feels sleepy from benadryl, no difficulty breathing.   [LM]  1416 2:15PM symptoms unchanged    [LM]  1645 Patient reports symptoms are improving, she is ready for discharge home.  Advised to continue with Benadryl and Pepcid, hold lisinopril until she sees her PCP.  Return to ER for any worsening or concerning symptoms.   [LM]    Clinical Course User Index [LM] Jeannie FendMurphy, Ciearra Rufo A, PA-C      Final Clinical Impressions(s) / ED Diagnoses   Final diagnoses:  Angioedema, initial encounter    ED Discharge Orders    None       Jeannie FendMurphy, Abeeha Twist A, PA-C 08/26/18 1646    Cathren LaineSteinl, Kevin, MD 08/31/18 1032

## 2018-08-26 NOTE — ED Triage Notes (Signed)
She states she awoke this morning to note that she has sublingual swelling. It is quite visible on exam. She denies any feeling of swelling in the back of her throat, nor any trouble breathing. She states she is on Lisinopril, and last took it two days ago (Fri.). She is ambulatory and in no distress.

## 2018-09-20 DIAGNOSIS — T783XXA Angioneurotic edema, initial encounter: Secondary | ICD-10-CM | POA: Insufficient documentation

## 2018-10-06 IMAGING — DX DG CHEST 2V
2 series · 2 of 2 positions shown · non-contrast
Comparison: 06/26/2015 and earlier.

CLINICAL DATA: 60-year-old male with cough for several weeks. Right
upper chest pain. Initial encounter.

EXAM:
CHEST  2 VIEW

[chest pa]
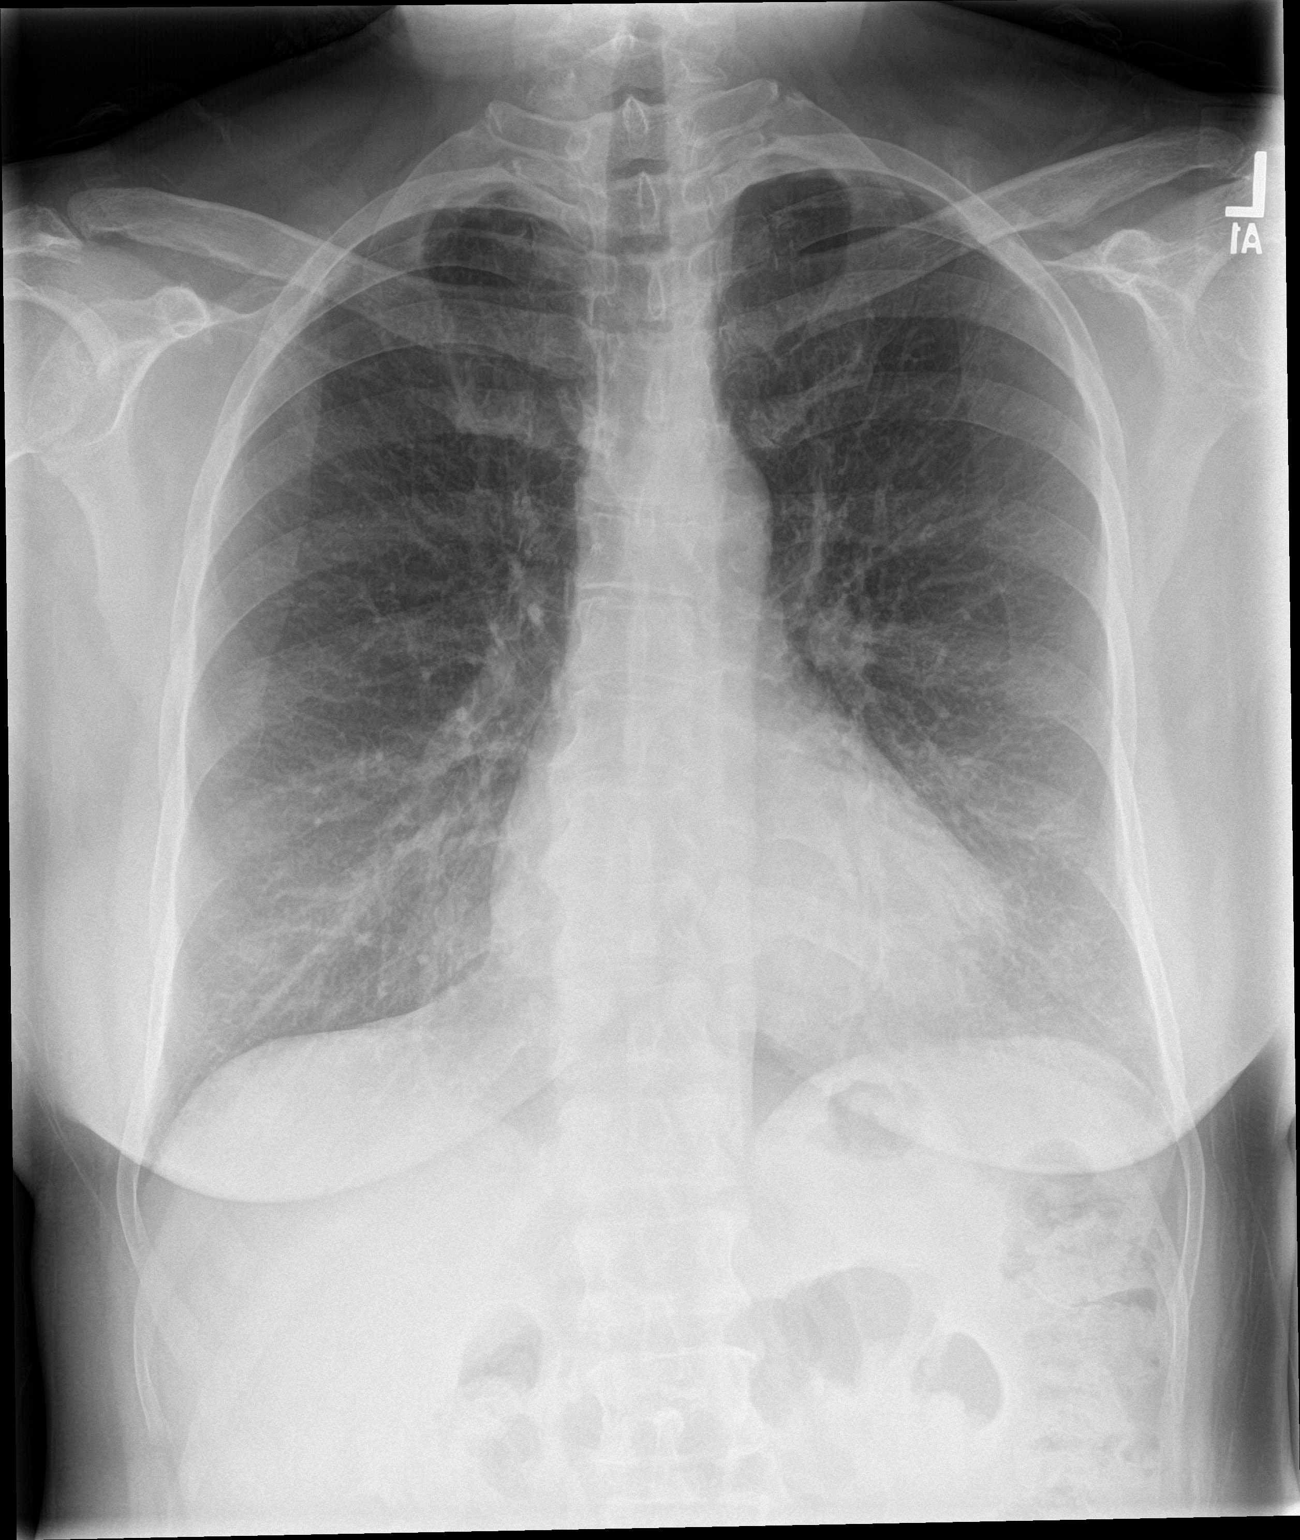

[chest lat]
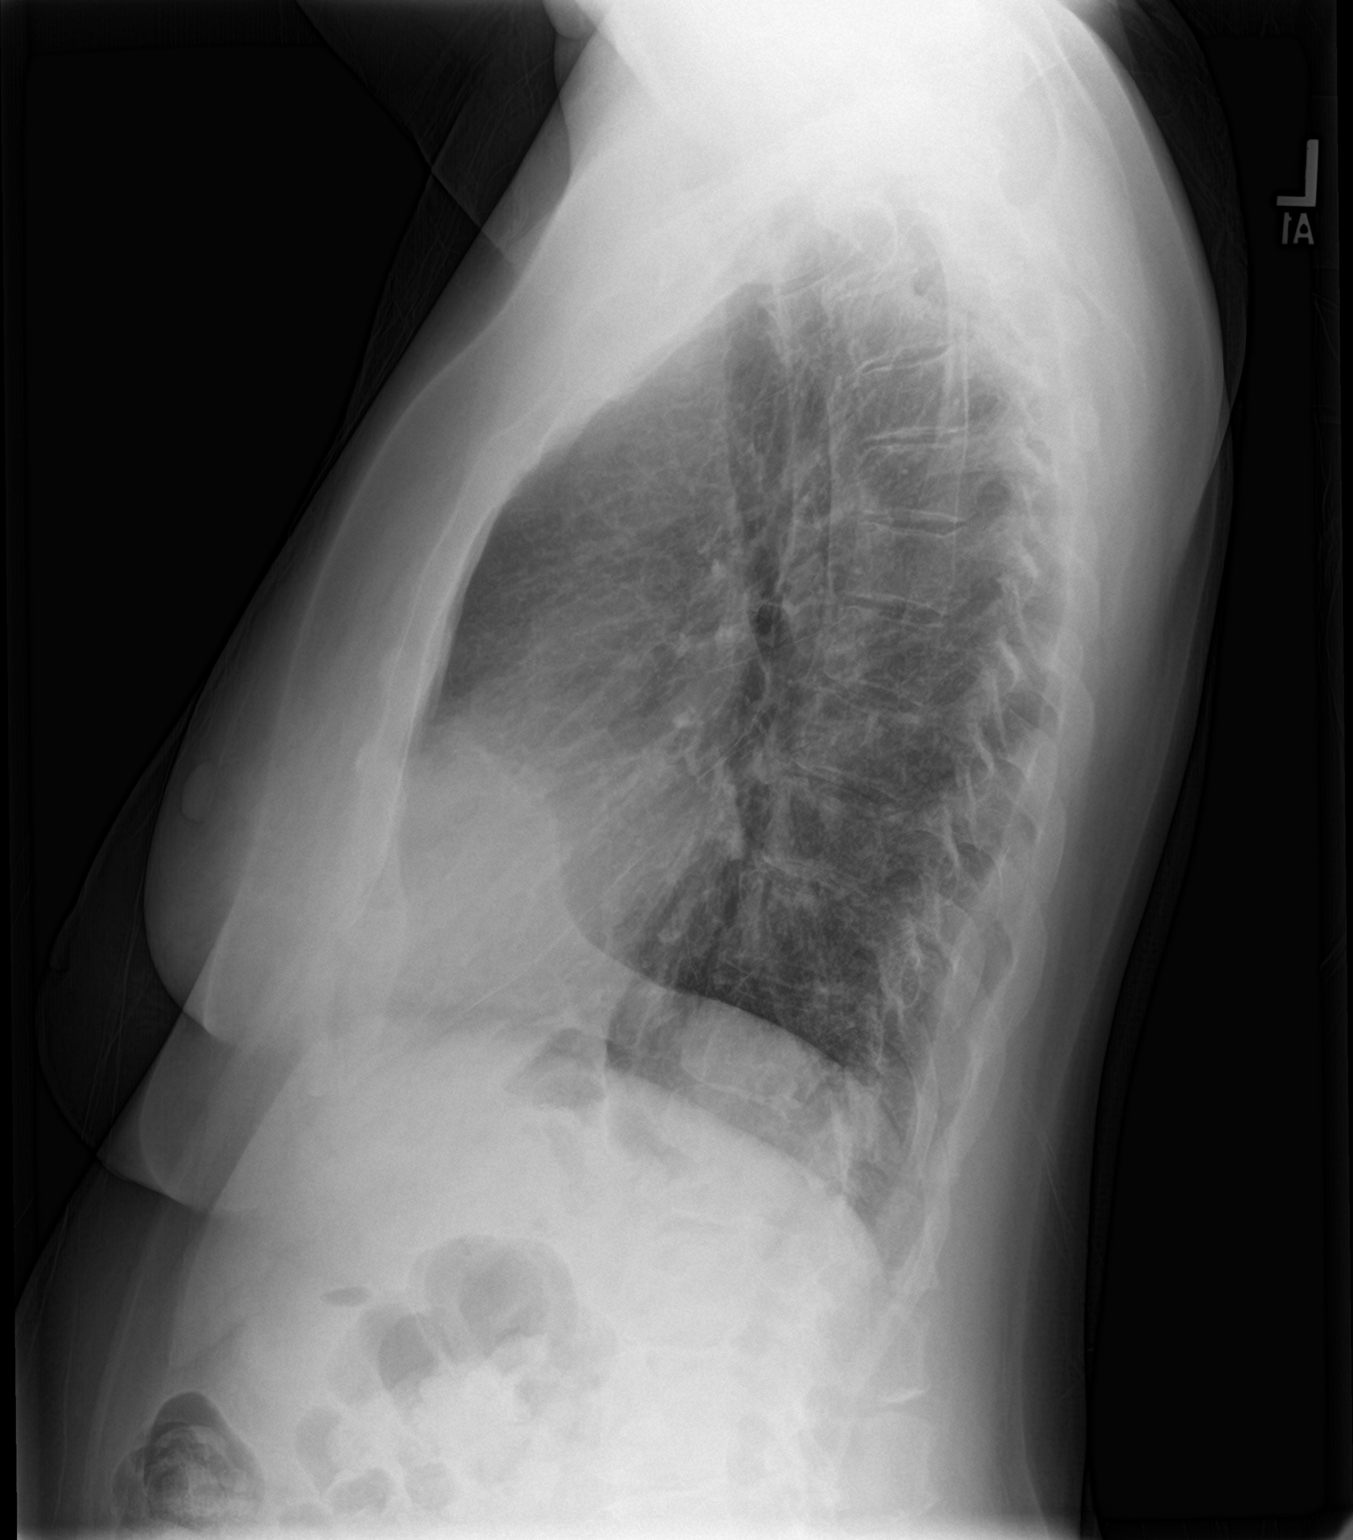

[2 of 2 positions shown; findings below may reference images not displayed]

FINDINGS: Stable cardiac size at the upper limits of normal. Other mediastinal
contours are within normal limits. Visualized tracheal air column is
within normal limits. Bilateral increased pulmonary interstitial
markings appear chronic and stable. No pneumothorax or pleural
effusion. No confluent pulmonary opacity. No acute osseous
abnormality identified. Negative visible bowel gas pattern.
IMPRESSION: Chronic pulmonary interstitial disease suspected. Stable mild
cardiomegaly. No acute cardiopulmonary abnormality.

## 2018-11-22 ENCOUNTER — Other Ambulatory Visit: Payer: Self-pay

## 2018-11-22 ENCOUNTER — Ambulatory Visit: Payer: Self-pay

## 2018-11-22 ENCOUNTER — Encounter: Payer: Self-pay | Admitting: Orthopaedic Surgery

## 2018-11-22 ENCOUNTER — Ambulatory Visit (INDEPENDENT_AMBULATORY_CARE_PROVIDER_SITE_OTHER): Payer: 59 | Admitting: Orthopaedic Surgery

## 2018-11-22 VITALS — BP 166/110 | HR 59 | Ht 64.0 in | Wt 156.0 lb

## 2018-11-22 DIAGNOSIS — G8929 Other chronic pain: Secondary | ICD-10-CM | POA: Diagnosis not present

## 2018-11-22 DIAGNOSIS — M25562 Pain in left knee: Secondary | ICD-10-CM

## 2018-11-22 DIAGNOSIS — M6281 Muscle weakness (generalized): Secondary | ICD-10-CM

## 2018-11-22 NOTE — Addendum Note (Signed)
Addended by: Meyer Cory on: 11/22/2018 04:55 PM   Modules accepted: Orders

## 2018-11-22 NOTE — Progress Notes (Signed)
Office Visit Note   Patient: Sharon Leonard           Date of Birth: 10/01/1955           MRN: [id number] Visit Date: 11/22/2018              Requested by: Manfred Shirts, [place],  [place] [id number] PCP: Manfred Shirts, [place]   Assessment & Plan: Visit Diagnoses:  1. Chronic pain of left knee   2. Quadriceps weakness     Plan: 2 years post on-the-job injury with ORIF patella fracture which is healed in near-anatomic position on x-ray.  She has chondromalacia of both knees and has quad weakness of both knees.  We had recommended an MRI back in January at this point I recommend proceeding with some physical therapy for quad strengthening working on terminal arc extension 0 to 20 degrees straight leg raising isometrics and straight leg raising against resistance.  She needs to avoid squats or other exercises were knee is flexed more than 20 degrees due to loading of the patellofemoral joint and likely aggravating her chondromalacia symptoms.  We will seek approval for some physical therapy and I can recheck her in a couple months.  She can continue regular work as she is been doing for the last 2 years but has had to avoid squatting due to her knee symptoms.  Krystal Clark, RN, CCM fax number [phone] was present for her exam discussion today.  Copy of the physical therapy prescription given to Ms. Plott.   Follow-Up Instructions: Return in about 2 months (around 01/22/2019).   Orders:  Orders Placed This Encounter  Procedures  . XR Knee 1-2 Views Left   No orders of the defined types were placed in this encounter.     Procedures: No procedures performed   Clinical Data: No additional findings.   Subjective: Chief Complaint  Patient presents with  . Left Knee - Pain    HPI 63 year old female returns she had an on-the-job injury with patella fracture treated with surgery on 10/24/2016 with 2 partially cannulated screws.  Her fracture has healed but she is continued  to have problems with her knee with catching.  She states she is able to walk and when she has pressure on her knee such as going up or down stairs she states she has increased pain and has difficulty.  If she walks stairs she has trouble sleeping at night.  Review of Systems reviewed updated unchanged from January office visit.   Objective: Vital Signs: BP (!) 166/110   Pulse (!) 59   Ht 5\' 4"  (1.626 m)   Wt 156 lb (70.8 kg)   BMI 26.78 kg/m   Physical Exam Constitutional:      Appearance: She is well-developed.  HENT:     Head: Normocephalic.     Right Ear: External ear normal.     Left Ear: External ear normal.  Eyes:     Pupils: Pupils are equal, round, and reactive to light.  Neck:     Thyroid: No thyromegaly.     Trachea: No tracheal deviation.  Cardiovascular:     Rate and Rhythm: Normal rate.  Pulmonary:     Effort: Pulmonary effort is normal.  Abdominal:     Palpations: Abdomen is soft.  Skin:    General: Skin is warm and dry.  Neurological:     Mental Status: She is alert and oriented to person, place, and time.  Psychiatric:        Behavior: Behavior normal.     Ortho Exam patient has crepitus with the opposite right knee extension.  She has slight quad weakness on the right more significant on the left when she comes out to full extension she has about a 3 degree extension lag passively I can get her to full extension and then she can hold her leg straight.  She has great difficulty going up on a single step here in the exam room with both right and left leg with more quad weakness on the left than right.  Abductors are strong negative logroll to the hips  Specialty Comments:  No specialty comments available.  Imaging: Xr Knee 1-2 Views Left  Result Date: 11/22/2018 Standing AP both knees lateral left knee and sunrise patellar x-ray are obtained and reviewed.  This shows healed transverse fracture of the patella fixed with 2 partially cannulated screws.   Patient has slight medial joint line narrowing of both knees.  Patella fracture is completely healed.  On lateral x-ray there does not appear to be a step-off in the articular cartilage.  Slight lateral spurring is noted on the patella on sunrise patellar x-ray.    PMFS History: Patient Active Problem List   Diagnosis Date Noted  . Quadriceps weakness 11/22/2018  . Synovitis of right knee 03/07/2018  . Knee locking, right 03/07/2018  . History of patellar fracture 05/09/2017   Past Medical History:  Diagnosis Date  . Arthritis    hips- bilateral  . Diabetes mellitus without complication (HCC)   . History of hiatal hernia   . History of kidney stones    passed spont.   . Hypertension     No family history on file.  Past Surgical History:  Procedure Laterality Date  . ABDOMINAL HYSTERECTOMY    . APPENDECTOMY    . ORIF PATELLA Left 10/24/2016   Procedure: OPEN REDUCTION INTERNAL (ORIF) FIXATION LEFT PATELLA;  Surgeon: [name], MD;  Location: [facility] OR;  Service: Orthopedics;  Laterality: Left;  . UTERINE FIBROID SURGERY     2x- surgery for fibroids   Social History   Occupational History  . Not on file  Tobacco Use  . Smoking status: Never Smoker  . Smokeless tobacco: Never Used  Substance and Sexual Activity  . Alcohol use: No  . Drug use: No  . Sexual activity: Not on file

## 2018-11-23 ENCOUNTER — Telehealth: Payer: Self-pay

## 2018-11-23 NOTE — Telephone Encounter (Signed)
Faxed office note to case mgr Krystal Clark [phone]

## 2018-11-23 NOTE — Telephone Encounter (Signed)
-----   Message from Marybelle Killings, MD sent at 11/22/2018  1:53 PM EDT ----- Cc to W/C and rehab RN

## 2019-01-08 ENCOUNTER — Telehealth: Payer: Self-pay | Admitting: Radiology

## 2019-01-08 NOTE — Telephone Encounter (Signed)
Sharon Leonard is calling states she is checking to see id our office had received her progress note from 12/18/2018 with them.  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

## 2019-01-08 NOTE — Telephone Encounter (Signed)
We do have this. It is in Dr. Lorin Mercy office pending signature.

## 2019-01-08 NOTE — Telephone Encounter (Signed)
noted 

## 2019-01-08 NOTE — Telephone Encounter (Signed)
Have you seen this ?

## 2019-01-10 ENCOUNTER — Ambulatory Visit (INDEPENDENT_AMBULATORY_CARE_PROVIDER_SITE_OTHER): Payer: 59 | Admitting: Orthopaedic Surgery

## 2019-01-10 ENCOUNTER — Encounter: Payer: Self-pay | Admitting: Orthopaedic Surgery

## 2019-01-10 VITALS — BP 157/83 | HR 79 | Ht 64.0 in | Wt 156.0 lb

## 2019-01-10 DIAGNOSIS — Z8781 Personal history of (healed) traumatic fracture: Secondary | ICD-10-CM

## 2019-01-10 NOTE — Progress Notes (Signed)
Office Visit Note   Patient: Sharon Leonard           Date of Birth: 04-05-1955           MRN: [id number] Visit Date: 01/10/2019              Requested by: Manfred Shirts, [place],  [place] [id number] PCP: Manfred Shirts, [place]   Assessment & Plan: Visit Diagnoses:  1. History of patellar fracture     Plan: Patient had previous on-the-job injury with fall patella fracture postoperative fixation.  She is having bilateral knee symptoms an MRI of her opposite right knee showed some mild tricompartmental arthritis.  She is using Voltaren cream some Aleve orally.  Her weight is not been a problem for her.  I would recommend patient finish out her physical therapy which she has several more visits.  After that she can be released from care.  No change in her previous sign impairment rating.  Patient is on regular work without restrictions new note given stating regular work.  Marie L. Plott, RN, CCM was not present today her normal fax number is [phone] and today her substitute rehab nurse was Danley Danker who was standing in.  Follow-Up Instructions: No follow-ups on file.   Orders:  No orders of the defined types were placed in this encounter.  No orders of the defined types were placed in this encounter.     Procedures: No procedures performed   Clinical Data: No additional findings.   Subjective: Chief Complaint  Patient presents with  . Left Knee - Pain, Follow-up  . Right Knee - Pain, Follow-up    HPI 63 year old female returns for follow-up post left patella ORIF with 2 screws on 10/24/2016.  She is continue to have problems with both knees.  She is working full-time.  I previously had radiotherapy for her left knee.  She continues to have problems with walking standing on her feet.  She saw Dr. Tonita Cong for a second opinion.  She has had an MRI scan of her opposite right knee which showed some mild tricompartmental degenerative arthritis but no meniscal  tear.  X-rays of her left patella showed complete healing.  Plain radiographs had shown some mild degenerative arthritis in the knee.  Patient's weight has been appropriate.  She continues to have problems with walking.  She has been using Voltaren gel on her knees which Dr. Tonita Cong prescribed.  Patient is also taking Aleve.  She has worked at her current location for about 13 years.  Review of Systems updated unchanged since surgery 2018.   Objective: Vital Signs: BP (!) 157/83   Pulse 79   Ht 5\' 4"  (1.626 m)   Wt 156 lb (70.8 kg)   BMI 26.78 kg/m   Physical Exam Constitutional:      Appearance: She is well-developed.  HENT:     Head: Normocephalic.     Right Ear: External ear normal.     Left Ear: External ear normal.  Eyes:     Pupils: Pupils are equal, round, and reactive to light.  Neck:     Thyroid: No thyromegaly.     Trachea: No tracheal deviation.  Cardiovascular:     Rate and Rhythm: Normal rate.  Pulmonary:     Effort: Pulmonary effort is normal.  Abdominal:     Palpations: Abdomen is soft.  Skin:    General: Skin is warm and dry.  Neurological:  Mental Status: She is alert and oriented to person, place, and time.  Psychiatric:        Behavior: Behavior normal.     Ortho Exam patient ambulates with a short stride slow deliberate gait.  There is crepitus with knee flexion extension both right and left.  Crepitus with the patellofemoral loading and quadriceps contracture.  She has fairly good quad strength right and left.  Normal heel toe gait.  Negative logroll to the legs negative popliteal compression test.  No Baker's cyst.  Specialty Comments:  No specialty comments available.  Imaging: No results found.   PMFS History: Patient Active Problem List   Diagnosis Date Noted  . Quadriceps weakness 11/22/2018  . Synovitis of right knee 03/07/2018  . Knee locking, right 03/07/2018  . History of patellar fracture 05/09/2017   Past Medical History:   Diagnosis Date  . Arthritis    hips- bilateral  . Diabetes mellitus without complication (HCC)   . History of hiatal hernia   . History of kidney stones    passed spont.   . Hypertension     No family history on file.  Past Surgical History:  Procedure Laterality Date  . ABDOMINAL HYSTERECTOMY    . APPENDECTOMY    . ORIF PATELLA Left 10/24/2016   Procedure: OPEN REDUCTION INTERNAL (ORIF) FIXATION LEFT PATELLA;  Surgeon: [name], MD;  Location: [facility] OR;  Service: Orthopedics;  Laterality: Left;  . UTERINE FIBROID SURGERY     2x- surgery for fibroids   Social History   Occupational History  . Not on file  Tobacco Use  . Smoking status: Never Smoker  . Smokeless tobacco: Never Used  Substance and Sexual Activity  . Alcohol use: No  . Drug use: No  . Sexual activity: Not on file

## 2019-01-14 ENCOUNTER — Encounter: Payer: Self-pay | Admitting: Orthopaedic Surgery

## 2019-01-14 ENCOUNTER — Telehealth: Payer: Self-pay

## 2019-01-14 NOTE — Telephone Encounter (Signed)
Faxed note to case mgr Krystal Clark [phone]

## 2019-01-22 ENCOUNTER — Ambulatory Visit: Payer: 59 | Admitting: Orthopaedic Surgery

## 2019-01-22 ENCOUNTER — Telehealth: Payer: Self-pay

## 2019-01-22 NOTE — Telephone Encounter (Signed)
No further authorization will be given for any further treatment per claims representative. Krystal Clark, RN, CCM, CDMS  Case Manager (note COVID 19 notice below)  [facility] [address], [place], [place] [address] Office: [phone]  Toll Free: [phone] FAX:  [phone]  Attn: Krystal Clark [email or web address]

## 2019-03-17 IMAGING — DX DG KNEE COMPLETE 4+V*L*
4 series · 4 of 4 positions shown · non-contrast
Comparison: None.

CLINICAL DATA: Anterior knee pain after falling 1 hour ago.

EXAM:
LEFT KNEE - COMPLETE 4+ VIEW

[knee ap]
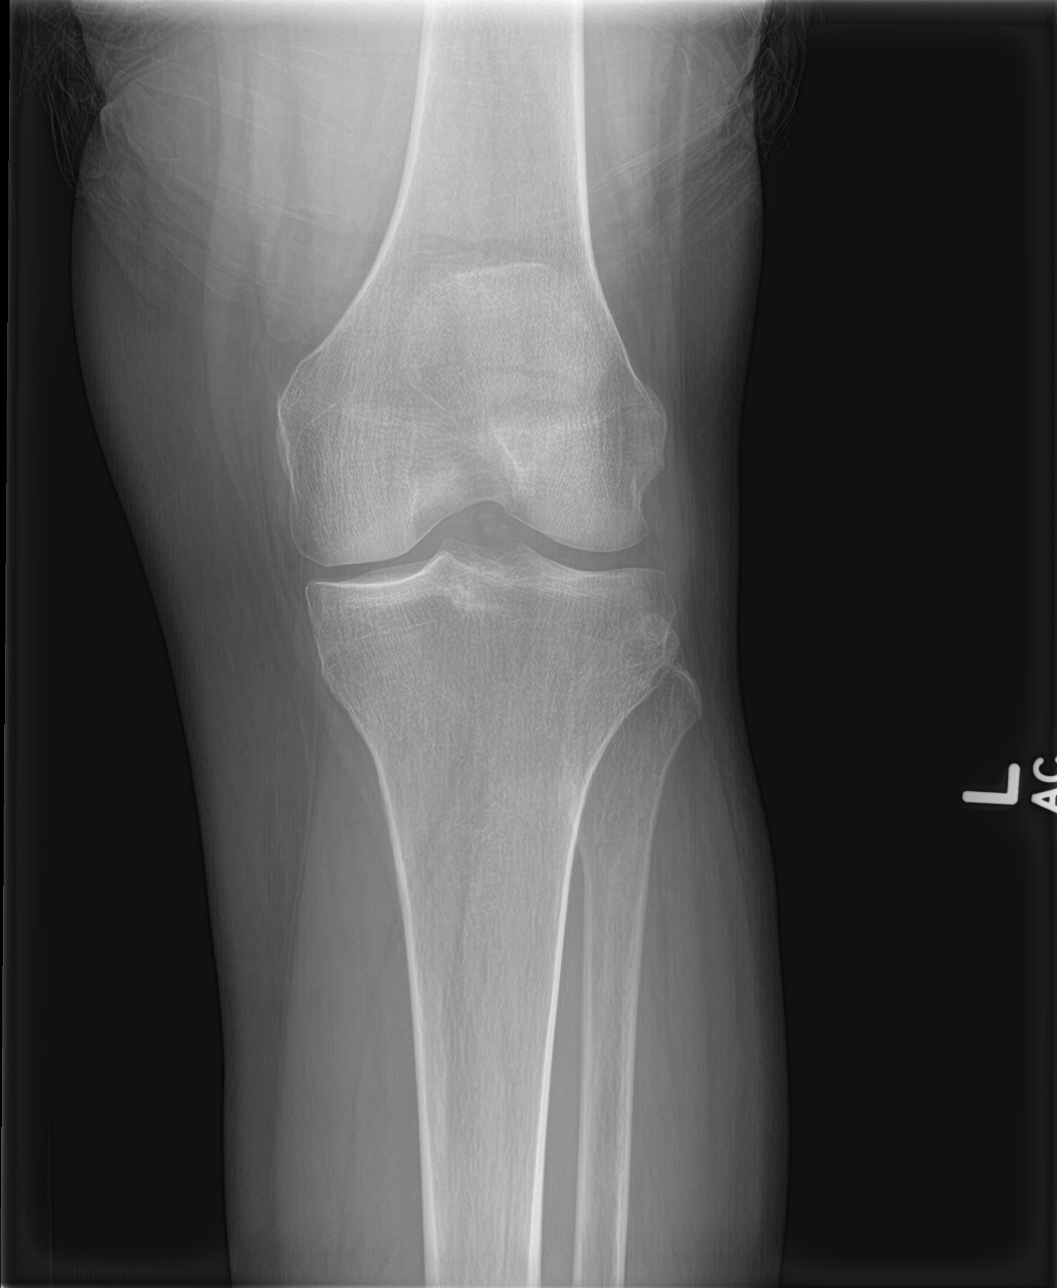

[tunnel]
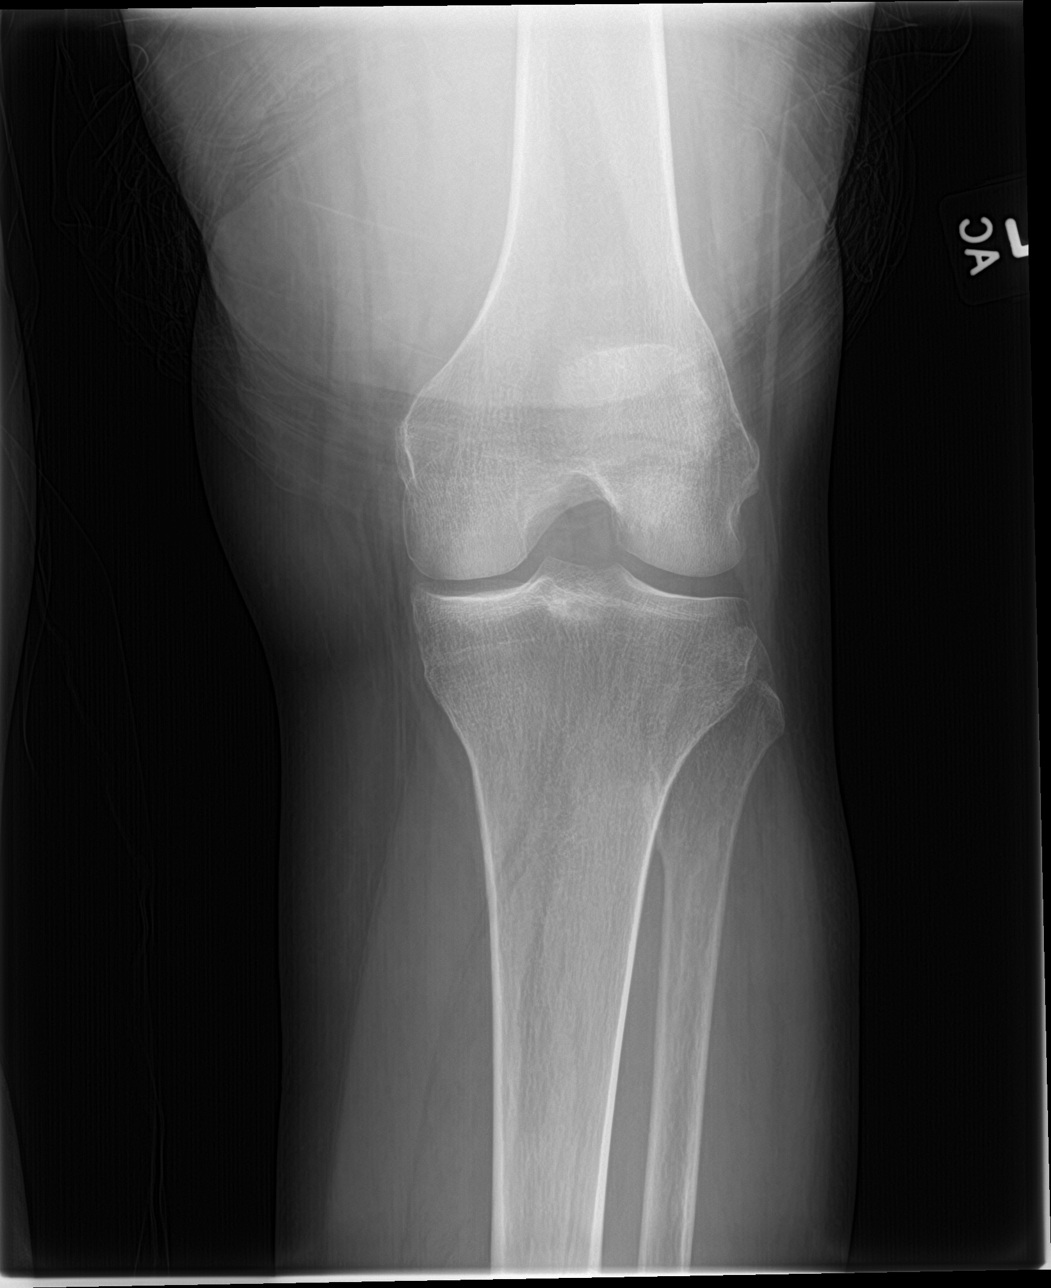

[knee lat]
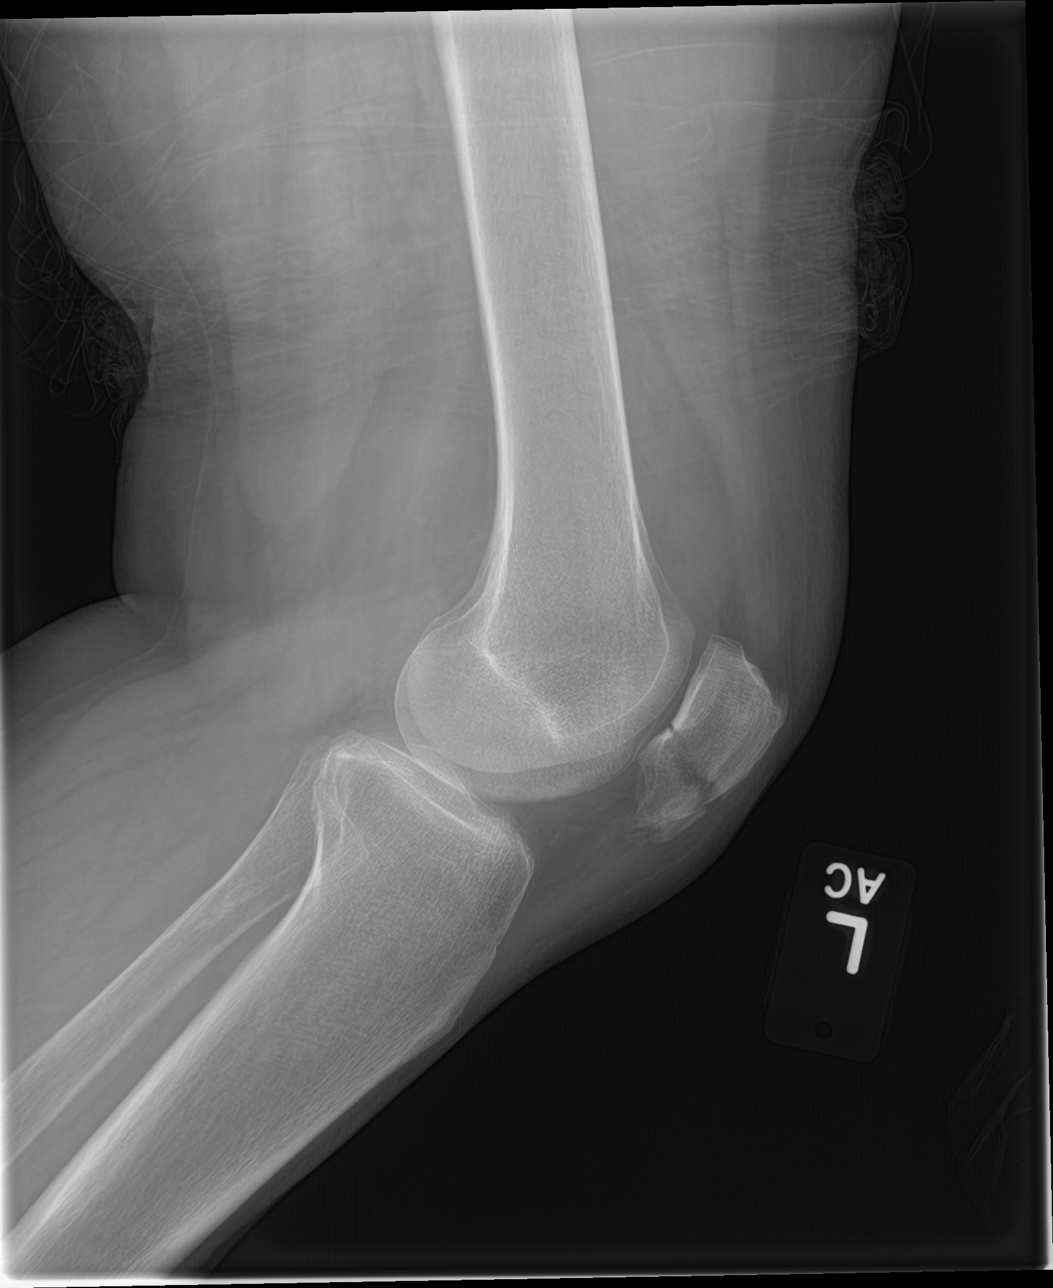

[knee sunrise]
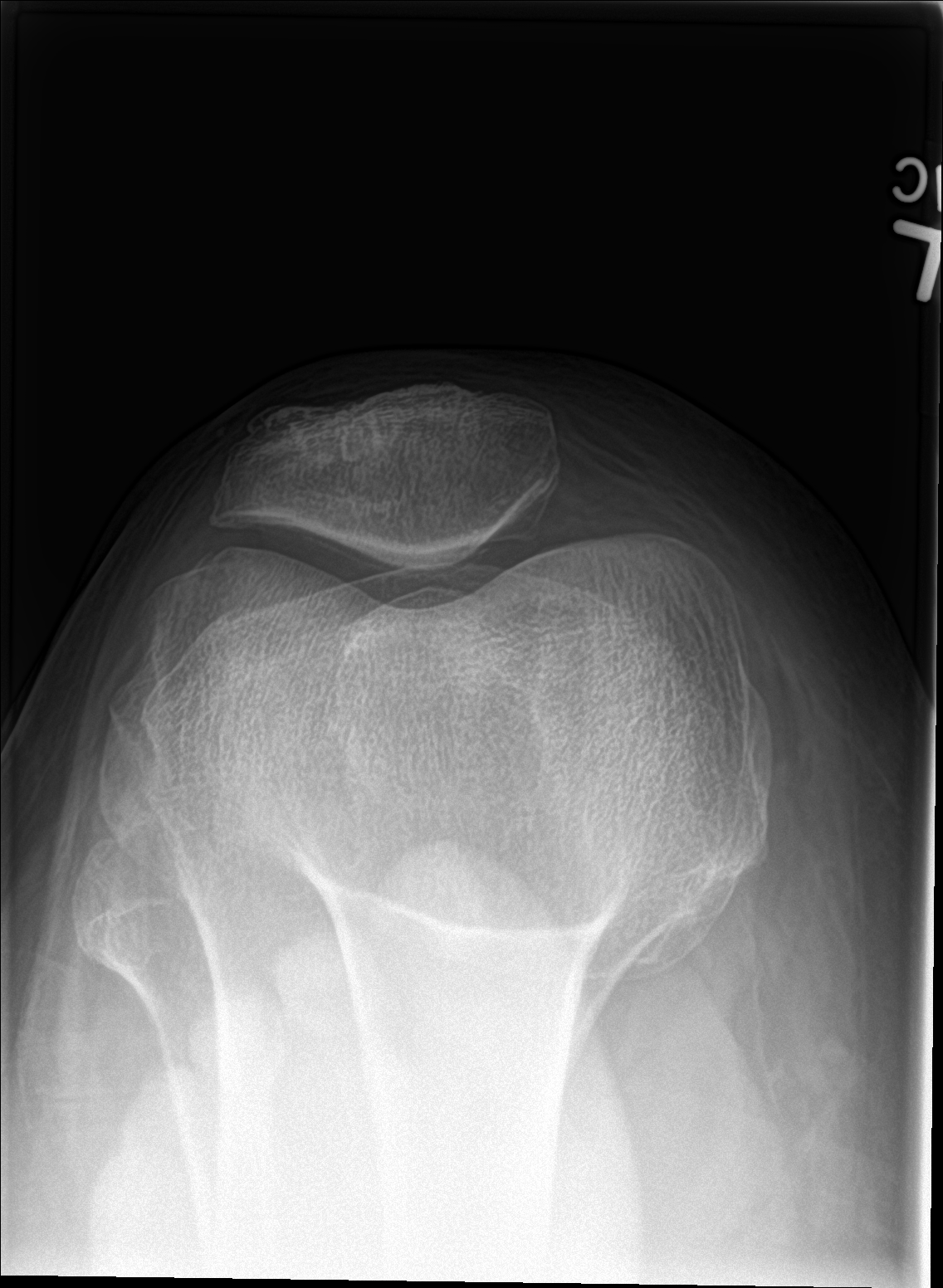

[4 of 4 positions shown; findings below may reference images not displayed]

FINDINGS: There is a transverse fracture through the midportion of the
patella. No significant distraction of the fracture fragments. No
other acute fracture. No dislocation.
IMPRESSION: Patellar fracture

## 2020-01-29 DIAGNOSIS — K420 Umbilical hernia with obstruction, without gangrene: Secondary | ICD-10-CM | POA: Insufficient documentation

## 2020-11-22 ENCOUNTER — Emergency Department (HOSPITAL_BASED_OUTPATIENT_CLINIC_OR_DEPARTMENT_OTHER)
Admission: EM | Admit: 2020-11-22 | Discharge: 2020-11-22 | Disposition: A | Discharge status: Home / Self Care | Payer: 59 | Attending: Emergency Medicine | Admitting: Emergency Medicine

## 2020-11-22 ENCOUNTER — Encounter (HOSPITAL_BASED_OUTPATIENT_CLINIC_OR_DEPARTMENT_OTHER): Payer: Self-pay

## 2020-11-22 ENCOUNTER — Other Ambulatory Visit: Payer: Self-pay

## 2020-11-22 DIAGNOSIS — E119 Type 2 diabetes mellitus without complications: Secondary | ICD-10-CM | POA: Diagnosis not present

## 2020-11-22 DIAGNOSIS — S39012A Strain of muscle, fascia and tendon of lower back, initial encounter: Secondary | ICD-10-CM | POA: Diagnosis not present

## 2020-11-22 DIAGNOSIS — I10 Essential (primary) hypertension: Secondary | ICD-10-CM | POA: Insufficient documentation

## 2020-11-22 DIAGNOSIS — Z91040 Latex allergy status: Secondary | ICD-10-CM | POA: Insufficient documentation

## 2020-11-22 DIAGNOSIS — Z7984 Long term (current) use of oral hypoglycemic drugs: Secondary | ICD-10-CM | POA: Insufficient documentation

## 2020-11-22 DIAGNOSIS — Z79899 Other long term (current) drug therapy: Secondary | ICD-10-CM | POA: Diagnosis not present

## 2020-11-22 DIAGNOSIS — S3992XA Unspecified injury of lower back, initial encounter: Secondary | ICD-10-CM | POA: Diagnosis present

## 2020-11-22 DIAGNOSIS — X500XXA Overexertion from strenuous movement or load, initial encounter: Secondary | ICD-10-CM | POA: Diagnosis not present

## 2020-11-22 DIAGNOSIS — Z7982 Long term (current) use of aspirin: Secondary | ICD-10-CM | POA: Insufficient documentation

## 2020-11-22 MED ORDER — CYCLOBENZAPRINE HCL 10 MG PO TABS: 10.0000 mg | ORAL_TABLET | Freq: Every evening | ORAL | 0 refills | Status: DC | PRN

## 2020-11-22 MED ORDER — KETOROLAC TROMETHAMINE 30 MG/ML IJ SOLN
30.0000 mg | Freq: Once | INTRAMUSCULAR | Status: AC
  Administered 2020-11-22: 30 mg via INTRAMUSCULAR
  Filled 2020-11-22: qty 1

## 2020-11-22 NOTE — Discharge Instructions (Addendum)
I am prescribing you a strong muscle relaxer called flexeril. Please only take this medication once in the evening with dinner. This medication can make you quite drowsy. Do not mix it with alcohol. Do not drive a vehicle after taking it.  ? ?If you develop any new or worsening symptoms please do not hesitate to return to the emergency department. It was a pleasure to meet you. ? ?

## 2020-11-22 NOTE — ED Triage Notes (Signed)
Pt arrives to triage in wheelchair reports she has been having pain to left side of lower back X3 weeks, states that she has been taking aleve at home without full relief from pain.

## 2020-11-22 NOTE — ED Provider Notes (Signed)
[facility] EMERGENCY DEPARTMENT Provider Note   CSN: [id number] Arrival date & time: 11/22/20  1722     History Chief Complaint  Patient presents with   Back Pain    Sharon Leonard is a 65 y.o. female.  HPI Patient is a 65 year old female with a medical history as noted below.  She presents to the emergency department due to left low back pain that started about 10 days ago.  Patient states that she lifted up a heavy vacuum prior to the onset of her symptoms.  States her symptoms have been constant.  Worsen with movement.  She has been taking Aleve with minimal relief.  Denies any numbness, weakness, saddle anesthesia, bowel or bladder incontinence.  Denies any urinary complaints.    Past Medical History:  Diagnosis Date   Arthritis    hips- bilateral   Diabetes mellitus without complication (HCC)    History of hiatal hernia    History of kidney stones    passed spont.    Hypertension     Patient Active Problem List   Diagnosis Date Noted   Quadriceps weakness 11/22/2018   Synovitis of right knee 03/07/2018   Knee locking, right 03/07/2018   History of patellar fracture 05/09/2017    Past Surgical History:  Procedure Laterality Date   ABDOMINAL HYSTERECTOMY     APPENDECTOMY     ORIF PATELLA Left 10/24/2016   Procedure: OPEN REDUCTION INTERNAL (ORIF) FIXATION LEFT PATELLA;  Surgeon: [name], MD;  Location: [facility] OR;  Service: Orthopedics;  Laterality: Left;   UTERINE FIBROID SURGERY     2x- surgery for fibroids     OB History   No obstetric history on file.     No family history on file.  Social History   Tobacco Use   Smoking status: Never   Smokeless tobacco: Never  Vaping Use   Vaping Use: Never used  Substance Use Topics   Alcohol use: No   Drug use: No    Home Medications Prior to Admission medications   Medication Sig Start Date End Date Taking? Authorizing Provider  cyclobenzaprine (FLEXERIL) 10 MG tablet Take 1 tablet (10 mg  total) by mouth at bedtime as needed for muscle spasms. 11/22/20  Yes Placido Sou, PA-C  aspirin 325 MG tablet Take 1 tablet (325 mg total) by mouth daily. 10/25/16   [name], MD  celecoxib (CELEBREX) 200 MG capsule Take 1 capsule (200 mg total) by mouth 2 (two) times daily. 12/23/17   Jacalyn Lefevre, MD  cetirizine (ZYRTEC) 10 MG tablet Take 10 mg by mouth daily as needed for allergies.     [provider]  HYDROcodone-acetaminophen (NORCO/VICODIN) 5-325 MG tablet Take 1 tablet by mouth every 4 (four) hours as needed. Patient not taking: Reported on 11/22/2018 12/23/17   Jacalyn Lefevre, MD  hydrocortisone cream 1 % Apply to affected area 2 times daily 05/25/17   Couture, Cortni S, PA-C  ibuprofen (ADVIL,MOTRIN) 200 MG tablet Take 400 mg by mouth 2 (two) times daily as needed for mild pain.    [provider]  JANUMET 50-500 MG tablet TAKE 1 TABLET BY MOUTH 2 (TWO) TIMES A DAY WITH MEALS. 06/23/16   [provider]  lisinopril (PRINIVIL,ZESTRIL) 5 MG tablet Take 5 mg by mouth daily.    [provider]  Multiple Vitamins-Minerals (ADVANCED DIABETIC MULTIVITAMIN PO) Take 1 tablet by mouth 2 (two) times daily.    [provider]  pravastatin (PRAVACHOL) 40 MG  tablet Take 40 mg by mouth daily.    [provider]  tobramycin-dexamethasone Wallene Dales) ophthalmic solution Place 1 drop into both eyes 4 (four) times daily as needed.    [provider]  traMADol (ULTRAM) 50 MG tablet Take 1 tablet (50 mg total) by mouth every 6 (six) hours as needed. Patient not taking: Reported on 11/22/2018 11/02/16   [name], MD    Allergies    Oxycodone, Raspberry, Strawberry extract, Sulfa antibiotics, Tramadol, Latex, and Shellfish allergy  Review of Systems   Review of Systems  Genitourinary:  Negative for decreased urine volume, difficulty urinating and dysuria.  Musculoskeletal:  Positive for back pain and myalgias.  Neurological:   Negative for weakness and numbness.   Physical Exam Updated Vital Signs BP (!) 161/63 (BP Location: Right Arm)   Pulse 69   Temp 98.4 F (36.9 C) (Oral)   Resp 14   Ht 5\' 4"  (1.626 m)   Wt 70.3 kg   SpO2 97%   BMI 26.61 kg/m   Physical Exam Vitals and nursing note reviewed.  Constitutional:      General: She is not in acute distress.    Appearance: She is well-developed.  HENT:     Head: Normocephalic and atraumatic.     Right Ear: External ear normal.     Left Ear: External ear normal.  Eyes:     General: No scleral icterus.       Right eye: No discharge.        Left eye: No discharge.     Conjunctiva/sclera: Conjunctivae normal.  Neck:     Trachea: No tracheal deviation.  Cardiovascular:     Rate and Rhythm: Normal rate.  Pulmonary:     Effort: Pulmonary effort is normal. No respiratory distress.     Breath sounds: No stridor.  Abdominal:     General: Abdomen is flat. There is no distension.     Palpations: Abdomen is soft.     Tenderness: There is no abdominal tenderness.     Comments: Abdomen is flat, soft, and nontender.  Musculoskeletal:        General: Tenderness present. No swelling or deformity.     Cervical back: Neck supple.     Comments: Moderate TTP noted along the left lumbar musculature.  No tenderness appreciated along the midline lumbar spine or the left hip.  Skin:    General: Skin is warm and dry.     Findings: No rash.  Neurological:     General: No focal deficit present.     Mental Status: She is alert and oriented to person, place, and time.     Cranial Nerves: Cranial nerve deficit: no gross deficits.     Comments: Strength is 5/5.  Bilateral lower extremities with plantar flexion and dorsiflexion.  2+ DP pulses.  Distal sensation intact.   ED Results / Procedures / Treatments   Labs (all labs ordered are listed, but only abnormal results are displayed) Labs Reviewed - No data to display  EKG None  Radiology No results  found.  Procedures Procedures   Medications Ordered in ED Medications  ketorolac (TORADOL) 30 MG/ML injection 30 mg (has no administration in time range)    ED Course  I have reviewed the triage vital signs and the nursing notes.  Pertinent labs & imaging results that were available during my care of the patient were reviewed by me and considered in my medical decision making (see chart for details).  MDM Rules/Calculators/A&P                          Patient is a 65 year old female who presents to the emergency department with left low back pain that began about 10 days ago after lifting a heavy vacuum.  Physical exam significant for tenderness along the left lumbar musculature.  No bony tenderness noted along midline lumbar spine or left hip. Pt is NVI in the BLEs.  Strength is 5/5 with plantarflexion and dorsiflexion.  2+ pedal pulses.  Distal sensation intact.  No red flags.  Patient denies any urinary complaints.  No bowel or bladder incontinence.  Doubt cauda equina at this time.  Did not feel imaging was warranted at this time.  Patient is agreeable.  We will give a dose of Toradol in the emergency department for pain.  Will discharge on a course of Flexeril.  We discussed safety regarding this medication.  We discussed return precautions.  Feel that she is stable for discharge at this time.  She is agreeable.  Her questions were answered and she was amicable at the time of discharge.  Final Clinical Impression(s) / ED Diagnoses Final diagnoses:  Strain of lumbar region, initial encounter    Rx / DC Orders ED Discharge Orders          Ordered    cyclobenzaprine (FLEXERIL) 10 MG tablet  At bedtime PRN        11/22/20 1857             Placido Sou, PA-C 11/22/20 1906    Charlynne Pander, MD 11/25/20 [phone]

## 2021-03-05 DIAGNOSIS — K421 Umbilical hernia with gangrene: Secondary | ICD-10-CM | POA: Insufficient documentation

## 2021-03-05 DIAGNOSIS — K432 Incisional hernia without obstruction or gangrene: Secondary | ICD-10-CM | POA: Insufficient documentation

## 2021-05-31 ENCOUNTER — Ambulatory Visit (INDEPENDENT_AMBULATORY_CARE_PROVIDER_SITE_OTHER): Payer: 59 | Admitting: Family Medicine

## 2021-05-31 ENCOUNTER — Encounter: Payer: Self-pay | Admitting: Family Medicine

## 2021-05-31 VITALS — BP 140/72 | HR 72 | Temp 97.0°F | Ht 64.0 in | Wt 153.2 lb

## 2021-05-31 DIAGNOSIS — K625 Hemorrhage of anus and rectum: Secondary | ICD-10-CM | POA: Insufficient documentation

## 2021-05-31 DIAGNOSIS — Z23 Encounter for immunization: Secondary | ICD-10-CM | POA: Diagnosis not present

## 2021-05-31 DIAGNOSIS — E119 Type 2 diabetes mellitus without complications: Secondary | ICD-10-CM | POA: Diagnosis not present

## 2021-05-31 DIAGNOSIS — E782 Mixed hyperlipidemia: Secondary | ICD-10-CM

## 2021-05-31 DIAGNOSIS — Z1211 Encounter for screening for malignant neoplasm of colon: Secondary | ICD-10-CM | POA: Insufficient documentation

## 2021-05-31 DIAGNOSIS — K219 Gastro-esophageal reflux disease without esophagitis: Secondary | ICD-10-CM | POA: Insufficient documentation

## 2021-05-31 DIAGNOSIS — I10 Essential (primary) hypertension: Secondary | ICD-10-CM | POA: Diagnosis not present

## 2021-05-31 MED ORDER — TRULICITY 0.75 MG/0.5ML ~~LOC~~ SOAJ: SUBCUTANEOUS | 6 refills | Status: DC

## 2021-05-31 MED ORDER — METFORMIN HCL 1000 MG PO TABS: 1000.0000 mg | ORAL_TABLET | Freq: Two times a day (BID) | ORAL | 3 refills | Status: DC

## 2021-05-31 NOTE — Progress Notes (Signed)
? ?New Patient Office Visit ? ?Subjective:  ?Patient ID: Sharon Leonard, female    DOB: 05-15-1955  Age: 66 y.o. MRN: [id number] ? ?CC:  ?Chief Complaint  ?Patient presents with  ? Establish Care  ?  NP/establish care, discuss medications.   ? ? ?HPI ?Sharon Leonard presents for establishment of care follow-up of hypertension, diabetes and mixed hyperlipidemia.  Continues pravastatin without issue for cholesterol.  Currently taking Trulicity and Janumet for diabetes.  She has been out of the Trulicity.  Currently taking amlodipine, lisinopril for hypertension.  Takes Nexium for GERD.  Osteoarthritis in right knee.  Being followed by orthopedics.  History of fracture of patella on left knee.  Experiences pain costo sternal articulation inferior sternum.  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 ? ?Past Medical History:  ?Diagnosis Date  ? Arthritis   ? hips- bilateral  ? Diabetes mellitus without complication ([place])   ? History of hiatal hernia   ? History of kidney stones   ? passed spont.   ? Hypertension   ? ? ?Past Surgical History:  ?Procedure Laterality Date  ? ABDOMINAL HYSTERECTOMY    ? APPENDECTOMY    ? ORIF PATELLA Left 10/24/2016  ? Procedure: OPEN REDUCTION INTERNAL (ORIF) FIXATION LEFT PATELLA;  Surgeon: Marybelle Killings, MD;  Location: [place];  Service: Orthopedics;  Laterality: Left;  ? UTERINE FIBROID SURGERY    ? 2x- surgery for fibroids  ? ? ?History reviewed. No pertinent family history. ? ?Social History  ? ?Socioeconomic History  ? Marital status: Single  ?  Spouse name: Not on file  ? Number of children: Not on file  ? Years of education: Not on file  ? Highest education level: Not on file  ?Occupational History  ? Not on file  ?Tobacco Use  ? Smoking status: Never  ? Smokeless tobacco: Never  ?Vaping Use  ? Vaping Use: Never used  ?Substance and Sexual Activity  ? Alcohol use: No  ? Drug use: No  ? Sexual activity: Not Currently  ?  Birth control/protection: Surgical  ?Other Topics  Concern  ? Not on file  ?Social History Narrative  ? Not on file  ? ?Social Determinants of Health  ? ?Financial Resource Strain: Not on file  ?Food Insecurity: Not on file  ?Transportation Needs: Not on file  ?Physical Activity: Not on file  ?Stress: Not on file  ?Social Connections: Not on file  ?Intimate Partner Violence: Not on file  ? ? ?ROS ?Review of Systems  ?Constitutional:  Negative for chills, diaphoresis, fatigue, fever and unexpected weight change.  ?HENT: Negative.    ?Eyes:  Negative for photophobia and visual disturbance.  ?Respiratory: Negative.    ?Cardiovascular: Negative.   ?Gastrointestinal: Negative.   ?Endocrine: Negative for polyphagia and polyuria.  ?Genitourinary: Negative.   ?Musculoskeletal:  Positive for arthralgias.  ?Neurological:  Negative for tremors, speech difficulty and weakness.  ? ?  05/31/2021  ?  2:10 PM  ?Depression screen PHQ 2/9  ?Decreased Interest 0  ?Down, Depressed, Hopeless 0  ?PHQ - 2 Score 0  ? ?Objective:  ? ?Today's Vitals: BP 140/72 (BP Location: Left Arm, Patient Position: Sitting, Cuff Size: Normal)   Pulse 72   Temp (!) 97 ?F (36.1 ?C) (Temporal)   Ht 5\' 4"  (1.626 m)   Wt 153 lb 3.2 oz (69.5 kg)   SpO2 96%   BMI 26.30 kg/m?  ? ?Physical Exam ?Vitals and nursing note reviewed.  ?Constitutional:   ?  General: She is not in acute distress. ?   Appearance: Normal appearance. She is not ill-appearing, toxic-appearing or diaphoretic.  ?HENT:  ?   Head: Normocephalic and atraumatic.  ?   Right Ear: Tympanic membrane, ear canal and external ear normal.  ?   Left Ear: Tympanic membrane, ear canal and external ear normal.  ?   Mouth/Throat:  ?   Mouth: Mucous membranes are moist.  ?   Pharynx: Oropharynx is clear. No oropharyngeal exudate or posterior oropharyngeal erythema.  ?Eyes:  ?   General: No scleral icterus.    ?   Right eye: No discharge.     ?   Left eye: No discharge.  ?   Extraocular Movements: Extraocular movements intact.  ?   Conjunctiva/sclera:  Conjunctivae normal.  ?   Pupils: Pupils are equal, round, and reactive to light.  ?Cardiovascular:  ?   Rate and Rhythm: Normal rate and regular rhythm.  ?   Pulses:     ?     Dorsalis pedis pulses are 2+ on the right side and 2+ on the left side.  ?     Posterior tibial pulses are 2+ on the right side and 2+ on the left side.  ?Pulmonary:  ?   Effort: Pulmonary effort is normal.  ?   Breath sounds: Normal breath sounds.  ?Abdominal:  ?   General: Bowel sounds are normal.  ?Musculoskeletal:  ?   Cervical back: No rigidity or tenderness.  ?Lymphadenopathy:  ?   Cervical: No cervical adenopathy.  ?Skin: ?   General: Skin is warm and dry.  ?Neurological:  ?   Mental Status: She is alert and oriented to person, place, and time.  ?Psychiatric:     ?   Mood and Affect: Mood normal.     ?   Behavior: Behavior normal.  ? ? ?  05/31/2021  ?  2:10 PM  ?Depression screen PHQ 2/9  ?Decreased Interest 0  ?Down, Depressed, Hopeless 0  ?PHQ - 2 Score 0  ? ?Diabetic Foot Exam - Simple   ?Simple Foot Form ?Diabetic Foot exam was performed with the following findings: Yes 05/31/2021  2:45 PM  ?Visual Inspection ?See comments: Yes ?Sensation Testing ?Intact to touch and monofilament testing bilaterally: Yes ?Pulse Check ?Posterior Tibialis and Dorsalis pulse intact bilaterally: Yes ?Comments ?Feet are cavus bilaterally without lesions or rashes. ?  ?  ? ?Assessment & Plan:  ? ?Problem List Items Addressed This Visit   ? ?  ? Cardiovascular and Mediastinum  ? Hypertension, benign  ? Relevant Medications  ? amLODipine (NORVASC) 5 MG tablet  ? Other Relevant Orders  ? CBC with Differential/Platelet  ? Comprehensive metabolic panel  ? Microalbumin / creatinine urine ratio  ? Urinalysis  ?  ? Endocrine  ? Type 2 diabetes mellitus without complications ([place])  ? Relevant Medications  ? Dulaglutide (TRULICITY) A999333 [id number] SOPN  ? metFORMIN (GLUCOPHAGE) 1000 MG tablet  ? Other Relevant Orders  ? Comprehensive metabolic panel  ? Hemoglobin A1c   ? Microalbumin / creatinine urine ratio  ? Urinalysis  ?  ? Other  ? Hyperlipidemia  ? Relevant Medications  ? amLODipine (NORVASC) 5 MG tablet  ? Other Relevant Orders  ? Comprehensive metabolic panel  ? Lipid panel  ? Need for shingles vaccine - Primary  ? Relevant Orders  ? Varicella-zoster vaccine IM (Shingrix)  ? ? ?Outpatient Encounter Medications as of 05/31/2021  ?Medication Sig  ? amLODipine (NORVASC) 5  MG tablet Take 5 mg by mouth daily.  ? aspirin 325 MG tablet Take 1 tablet (325 mg total) by mouth daily.  ? cetirizine (ZYRTEC) 10 MG tablet Take 10 mg by mouth daily as needed for allergies.   ? cyclobenzaprine (FLEXERIL) 10 MG tablet Take 1 tablet (10 mg total) by mouth at bedtime as needed for muscle spasms.  ? hydrocortisone cream 1 % Apply to affected area 2 times daily  ? lisinopril (PRINIVIL,ZESTRIL) 5 MG tablet Take 5 mg by mouth daily.  ? metFORMIN (GLUCOPHAGE) 1000 MG tablet Take 1 tablet (1,000 mg total) by mouth 2 (two) times daily with a meal.  ? pravastatin (PRAVACHOL) 40 MG tablet Take 40 mg by mouth daily.  ? tobramycin-dexamethasone (TOBRADEX) ophthalmic solution Place 1 drop into both eyes 4 (four) times daily as needed.  ? [DISCONTINUED] amLODipine (NORVASC) 5 MG tablet Take 1 tablet by mouth daily.  ? [DISCONTINUED] bupivacaine (MARCAINE) 0.25 % injection Inject into the skin.  ? [DISCONTINUED] Dulaglutide (TRULICITY) A999333 0000000 SOPN INJECT 0.75 MG INTO THE SKIN EVERY 7 DAYS FOR 7 DAYS.  ? [DISCONTINUED] JANUMET 50-1000 MG tablet Take 1 tablet by mouth 2 (two) times daily.  ? [DISCONTINUED] lisinopril (ZESTRIL) 5 MG tablet Take 1 tablet by mouth daily with breakfast.  ? [DISCONTINUED] triamcinolone acetonide (TRIESENCE) 40 MG/ML SUSP Inject into the articular space.  ? Dulaglutide (TRULICITY) A999333 0000000 SOPN INJECT 0.75 MG INTO THE SKIN EVERY 7 DAYS FOR 7 DAYS.  ? Esomeprazole Magnesium (NEXIUM PO) NexIUM  ? Multiple Vitamins-Minerals (ADVANCED DIABETIC MULTIVITAMIN PO) Take 1  tablet by mouth 2 (two) times daily. (Patient not taking: Reported on 05/31/2021)  ? [DISCONTINUED] celecoxib (CELEBREX) 200 MG capsule Take 1 capsule (200 mg total) by mouth 2 (two) times daily.  ? [DISCONTINUED] HYDROcod

## 2021-06-10 ENCOUNTER — Other Ambulatory Visit: Payer: 59

## 2021-06-11 ENCOUNTER — Other Ambulatory Visit (INDEPENDENT_AMBULATORY_CARE_PROVIDER_SITE_OTHER): Payer: 59

## 2021-06-11 DIAGNOSIS — E782 Mixed hyperlipidemia: Secondary | ICD-10-CM

## 2021-06-11 DIAGNOSIS — E119 Type 2 diabetes mellitus without complications: Secondary | ICD-10-CM

## 2021-06-11 DIAGNOSIS — I10 Essential (primary) hypertension: Secondary | ICD-10-CM | POA: Diagnosis not present

## 2021-06-11 LAB — COMPREHENSIVE METABOLIC PANEL WITH GFR
ALT: 11 U/L (ref 0–35)
AST: 15 U/L (ref 0–37)
Albumin: 4.1 g/dL (ref 3.5–5.2)
Alkaline Phosphatase: 81 U/L (ref 39–117)
BUN: 9 mg/dL (ref 6–23)
CO2: 26 meq/L (ref 19–32)
Calcium: 9.2 mg/dL (ref 8.4–10.5)
Chloride: 103 meq/L (ref 96–112)
Creatinine, Ser: 0.72 mg/dL (ref 0.40–1.20)
GFR: 88.15 mL/min
Glucose, Bld: 161 mg/dL — ABNORMAL HIGH (ref 70–99)
Potassium: 3.6 meq/L (ref 3.5–5.1)
Sodium: 136 meq/L (ref 135–145)
Total Bilirubin: 0.7 mg/dL (ref 0.2–1.2)
Total Protein: 7.1 g/dL (ref 6.0–8.3)

## 2021-06-11 LAB — URINALYSIS, ROUTINE W REFLEX MICROSCOPIC
Bilirubin Urine: NEGATIVE
Hgb urine dipstick: NEGATIVE
Ketones, ur: NEGATIVE
Nitrite: NEGATIVE
Specific Gravity, Urine: 1.015 (ref 1.000–1.030)
Total Protein, Urine: NEGATIVE
Urine Glucose: NEGATIVE
Urobilinogen, UA: 0.2 (ref 0.0–1.0)
pH: 6 (ref 5.0–8.0)

## 2021-06-11 LAB — CBC WITH DIFFERENTIAL/PLATELET
Basophils Absolute: 0.1 10*3/uL (ref 0.0–0.1)
Basophils Relative: 1 % (ref 0.0–3.0)
Eosinophils Absolute: 0.2 10*3/uL (ref 0.0–0.7)
Eosinophils Relative: 4.3 % (ref 0.0–5.0)
HCT: 37.8 % (ref 36.0–46.0)
Hemoglobin: 12.7 g/dL (ref 12.0–15.0)
Lymphocytes Relative: 36 % (ref 12.0–46.0)
Lymphs Abs: 1.9 10*3/uL (ref 0.7–4.0)
MCHC: 33.6 g/dL (ref 30.0–36.0)
MCV: 82.9 fl (ref 78.0–100.0)
Monocytes Absolute: 0.6 10*3/uL (ref 0.1–1.0)
Monocytes Relative: 11.5 % (ref 3.0–12.0)
Neutro Abs: 2.5 10*3/uL (ref 1.4–7.7)
Neutrophils Relative %: 47.2 % (ref 43.0–77.0)
Platelets: 280 10*3/uL (ref 150.0–400.0)
RBC: 4.56 Mil/uL (ref 3.87–5.11)
RDW: 13.1 % (ref 11.5–15.5)
WBC: 5.4 10*3/uL (ref 4.0–10.5)

## 2021-06-11 LAB — LIPID PANEL
Cholesterol: 210 mg/dL — ABNORMAL HIGH (ref 0–200)
HDL: 65.9 mg/dL
LDL Cholesterol: 130 mg/dL — ABNORMAL HIGH (ref 0–99)
NonHDL: 143.73
Total CHOL/HDL Ratio: 3
Triglycerides: 70 mg/dL (ref 0.0–149.0)
VLDL: 14 mg/dL (ref 0.0–40.0)

## 2021-06-11 LAB — HEMOGLOBIN A1C: Hgb A1c MFr Bld: 7.7 % — ABNORMAL HIGH (ref 4.6–6.5)

## 2021-06-11 LAB — MICROALBUMIN / CREATININE URINE RATIO
Creatinine,U: 91.5 mg/dL
Microalb Creat Ratio: 1.1 mg/g (ref 0.0–30.0)
Microalb, Ur: 1 mg/dL (ref 0.0–1.9)

## 2021-06-14 MED ORDER — PRAVASTATIN SODIUM 80 MG PO TABS: 80.0000 mg | ORAL_TABLET | Freq: Every day | ORAL | 1 refills | Status: DC

## 2021-07-05 ENCOUNTER — Telehealth: Payer: Self-pay | Admitting: Family Medicine

## 2021-07-05 DIAGNOSIS — E119 Type 2 diabetes mellitus without complications: Secondary | ICD-10-CM

## 2021-07-05 MED ORDER — TRULICITY 0.75 MG/0.5ML ~~LOC~~ SOAJ: SUBCUTANEOUS | 6 refills | Status: DC

## 2021-07-05 NOTE — Telephone Encounter (Signed)
Caller Name: [name] ?Call back phone #: [phone] ? ?MEDICATION(S): Dulaglutide (TRULICITY) [id number] SOPN [id number]  ? ? ?Days of Med Remaining: she will be out tomorrow 07/06/21 ? ?Has the patient contacted their pharmacy (YES/NO)?  Yes  ?IF YES, when and what did the pharmacy advise? Contact your pcp  ?IF NO, request that the patient contact the pharmacy for the refills in the future.  ?           The pharmacy will send an electronic request (except for controlled medications). ? ?Preferred Pharmacy: [id number] - [place], [place] - [address] AT [place]  ?[address], [place] [id number]  ?Phone:  [phone]  Fax:  [phone]  ?DEA #[id number] ? ?~~~Please advise patient/caregiver to allow 2-3 business days to process RX refills. ? ?

## 2021-07-05 NOTE — Telephone Encounter (Signed)
Refill sent in

## 2021-08-04 ENCOUNTER — Telehealth: Payer: Self-pay

## 2021-08-04 NOTE — Telephone Encounter (Signed)
Pt is calling in regards to her refill for Trulicity. She states there is a form that needed to be sent to [facility] at [address] in [place].  She would like someone to let her know something asap  Thank you

## 2021-08-11 NOTE — Telephone Encounter (Signed)
Called patient informed that trulicity was not being covered by her insurance, patient would like to discuss this with Dr. Doreene Burke would like to know if there was something else she could take. Patient was at work at the time of the call but states she would call back. Please advise

## 2021-09-02 ENCOUNTER — Ambulatory Visit (INDEPENDENT_AMBULATORY_CARE_PROVIDER_SITE_OTHER): Payer: 59

## 2021-09-02 DIAGNOSIS — Z23 Encounter for immunization: Secondary | ICD-10-CM | POA: Diagnosis not present

## 2021-09-02 NOTE — Progress Notes (Signed)
Per orders of Dr.Kremer pt is here for 2nd dose of Shingles vaccine. Pt received 2nd dose shingles vaccine in 2nd dose Shingles at 2:15 pm. Given by [place] L. CMA/CPT, Pt tolerated 2nd dose shingles vaccine well. Dose 2/2. Series completed.

## 2021-10-10 ENCOUNTER — Ambulatory Visit (HOSPITAL_COMMUNITY)
Admission: EM | Admit: 2021-10-10 | Discharge: 2021-10-10 | Disposition: A | Discharge status: Home / Self Care | Payer: Medicare Other | Attending: Internal Medicine | Admitting: Internal Medicine

## 2021-10-10 ENCOUNTER — Ambulatory Visit (INDEPENDENT_AMBULATORY_CARE_PROVIDER_SITE_OTHER): Payer: Medicare Other

## 2021-10-10 DIAGNOSIS — R059 Cough, unspecified: Secondary | ICD-10-CM

## 2021-10-10 DIAGNOSIS — J069 Acute upper respiratory infection, unspecified: Secondary | ICD-10-CM | POA: Diagnosis not present

## 2021-10-10 DIAGNOSIS — R110 Nausea: Secondary | ICD-10-CM | POA: Diagnosis not present

## 2021-10-10 DIAGNOSIS — R053 Chronic cough: Secondary | ICD-10-CM

## 2021-10-10 MED ORDER — ONDANSETRON 4 MG PO TBDP: 4.0000 mg | ORAL_TABLET | Freq: Three times a day (TID) | ORAL | 0 refills | Status: DC | PRN

## 2021-10-10 MED ORDER — GUAIFENESIN 200 MG PO TABS: 200.0000 mg | ORAL_TABLET | ORAL | 0 refills | Status: DC | PRN

## 2021-10-10 MED ORDER — AMOXICILLIN-POT CLAVULANATE 875-125 MG PO TABS: 1.0000 | ORAL_TABLET | Freq: Two times a day (BID) | ORAL | 0 refills | Status: DC

## 2021-10-10 NOTE — ED Provider Notes (Signed)
[facility]    CSN: [id number] Arrival date & time: 10/10/21  1411      History   Chief Complaint Chief Complaint  Patient presents with   URI    HPI Sharon Leonard is a 66 y.o. female.   Patient presents with nausea without vomiting, nasal congestion, cough "lump in her throat" that has been present for about a week.  Patient reports that her sister has similar symptoms.  Patient reports that it feels like the mucus is "fluttering" in her chest and is going up her throat.  Patient has taken "all of the medications over-the-counter" with no improvement of symptoms.  Denies history of asthma or COPD and patient is not a smoker.  Denies chest pain, shortness of breath, diarrhea, abdominal pain.   URI   Past Medical History:  Diagnosis Date   Arthritis    hips- bilateral   Diabetes mellitus without complication ([facility])    History of hiatal hernia    History of kidney stones    passed spont.    Hypertension     Patient Active Problem List   Diagnosis Date Noted   Colon cancer screening 05/31/2021   Gastro-esophageal reflux disease without esophagitis 05/31/2021   Hemorrhage of anus and rectum 05/31/2021   Need for shingles vaccine 05/31/2021   Incisional hernia, without obstruction or gangrene 03/05/2021   Umbilical hernia with gangrene 03/05/2021   Umbilical hernia with obstruction, without gangrene 01/29/2020   Quadriceps weakness 11/22/2018   Angioedema 09/20/2018   Osteoarthritis of knee 06/07/2018   Pain in right knee 06/07/2018   Synovitis of right knee 03/07/2018   Knee locking, right 03/07/2018   Closed fracture of left patella 09/08/2017   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   History of patellar fracture 05/09/2017   Athlete's foot on left 01/10/2014   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   Nausea 12/25/2013   Degenerative lumbar disc 11/18/2013   Thoracic or lumbosacral neuritis or radiculitis, unspecified 11/18/2013   Primary osteoarthritis of  right hip 11/18/2013   Hip pain 10/09/2013   Blood in urine 07/31/2013   Dysuria 07/31/2013   Urinary tract infection 07/31/2013   Helicobacter pylori infection 06/07/2013   Hyperlipidemia 08/04/2012   Herpes simplex 08/04/2012   Hypertension, benign 08/04/2012   Type 2 diabetes mellitus without complications ([facility]) 08/04/2012    Past Surgical History:  Procedure Laterality Date   ABDOMINAL HYSTERECTOMY     APPENDECTOMY     ORIF PATELLA Left 10/24/2016   Procedure: OPEN REDUCTION INTERNAL (ORIF) FIXATION LEFT PATELLA;  Surgeon: [name], MD;  Location[facility];  Service: Orthopedics;  Laterality: Left;   UTERINE FIBROID SURGERY     2x- surgery for fibroids    OB History   No obstetric history on file.      Home Medications    Prior to Admission medications   Medication Sig Start Date End Date Taking? Authorizing Provider  amoxicillin-clavulanate (AUGMENTIN) 875-125 MG tablet Take 1 tablet by mouth every 12 (twelve) hours. 10/10/21  Yes Labrisha Wuellner, Rolly Salter E, FNP  guaiFENesin 200 MG tablet Take 1 tablet (200 mg total) by mouth every 4 (four) hours as needed for cough or to loosen phlegm. 10/10/21  Yes Chitara Clonch, Rolly Salter E, FNP  ondansetron (ZOFRAN-ODT) 4 MG disintegrating tablet Take 1 tablet (4 mg total) by mouth every 8 (eight) hours as needed for nausea or vomiting. 10/10/21  Yes Ervin Knack E, FNP  amLODipine (NORVASC) 5 MG tablet Take 5 mg by mouth daily. 01/24/21  [provider]  aspirin 325 MG tablet Take 1 tablet (325 mg total) by mouth daily. 10/25/16   [name], MD  cetirizine (ZYRTEC) 10 MG tablet Take 10 mg by mouth daily as needed for allergies.     [provider]  cyclobenzaprine (FLEXERIL) 10 MG tablet Take 1 tablet (10 mg total) by mouth at bedtime as needed for muscle spasms. 11/22/20   Placido Sou, PA-C  Dulaglutide (TRULICITY) 0.75 MG/0.5ML SOPN INJECT 0.75 MG INTO THE SKIN EVERY 7 DAYS FOR 7 DAYS. 07/05/21   Mliss Sax, MD   Esomeprazole Magnesium (NEXIUM PO) NexIUM    [provider]  hydrocortisone cream 1 % Apply to affected area 2 times daily 05/25/17   Couture, Cortni S, PA-C  lisinopril (PRINIVIL,ZESTRIL) 5 MG tablet Take 5 mg by mouth daily.    [provider]  metFORMIN (GLUCOPHAGE) 1000 MG tablet Take 1 tablet (1,000 mg total) by mouth 2 (two) times daily with a meal. 05/31/21   Mliss Sax, MD  Multiple Vitamins-Minerals (ADVANCED DIABETIC MULTIVITAMIN PO) Take 1 tablet by mouth 2 (two) times daily. Patient not taking: Reported on 05/31/2021    [provider]  pravastatin (PRAVACHOL) 80 MG tablet Take 1 tablet (80 mg total) by mouth daily. 06/14/21   Mliss Sax, MD  tobramycin-dexamethasone [facility]) ophthalmic solution Place 1 drop into both eyes 4 (four) times daily as needed.    [provider]    Family History No family history on file.  Social History Social History   Tobacco Use   Smoking status: Never   Smokeless tobacco: Never  Vaping Use   Vaping Use: Never used  Substance Use Topics   Alcohol use: No   Drug use: No     Allergies   Ezetimibe, Oxycodone, Raspberry, Strawberry extract, Sulfa antibiotics, Tramadol, Latex, and Shellfish allergy   Review of Systems Review of Systems Per HPI  Physical Exam Triage Vital Signs ED Triage Vitals  Enc Vitals Group     BP 10/10/21 1550 (!) 178/62     Pulse Rate 10/10/21 1550 65     Resp 10/10/21 1550 18     Temp 10/10/21 1550 97.7 F (36.5 C)     Temp src --      SpO2 10/10/21 1550 100 %     Weight --      Height --      Head Circumference --      Peak Flow --      Pain Score 10/10/21 1549 0     Pain Loc --      Pain Edu? --      Excl. in GC? --    No data found.  Updated Vital Signs BP (!) 158/68   Pulse 65   Temp 97.7 F (36.5 C)   Resp 18   SpO2 100%   Visual Acuity Right Eye Distance:   Left Eye Distance:   Bilateral Distance:    Right Eye Near:    Left Eye Near:    Bilateral Near:     Physical Exam Constitutional:      General: She is not in acute distress.    Appearance: Normal appearance. She is not toxic-appearing or diaphoretic.  HENT:     Head: Normocephalic and atraumatic.     Right Ear: Tympanic membrane and ear canal normal.     Left Ear: Tympanic membrane and ear canal normal.     Nose: Congestion present.  Mouth/Throat:     Mouth: Mucous membranes are moist.     Pharynx: No posterior oropharyngeal erythema.  Eyes:     Extraocular Movements: Extraocular movements intact.     Conjunctiva/sclera: Conjunctivae normal.     Pupils: Pupils are equal, round, and reactive to light.  Cardiovascular:     Rate and Rhythm: Normal rate and regular rhythm.     Pulses: Normal pulses.     Heart sounds: Normal heart sounds.  Pulmonary:     Effort: Pulmonary effort is normal. No respiratory distress.     Breath sounds: Normal breath sounds. No stridor. No wheezing, rhonchi or rales.  Abdominal:     General: Abdomen is flat. Bowel sounds are normal.     Palpations: Abdomen is soft.  Musculoskeletal:        General: Normal range of motion.     Cervical back: Normal range of motion.  Skin:    General: Skin is warm and dry.  Neurological:     General: No focal deficit present.     Mental Status: She is alert and oriented to person, place, and time. Mental status is at baseline.     Cranial Nerves: Cranial nerves 2-12 are intact.     Sensory: Sensation is intact.     Motor: Motor function is intact.     Coordination: Coordination is intact.     Gait: Gait is intact.  Psychiatric:        Mood and Affect: Mood normal.        Behavior: Behavior normal.      UC Treatments / Results  Labs (all labs ordered are listed, but only abnormal results are displayed) Labs Reviewed - No data to display  EKG   Radiology DG Chest 2 View  Result Date: 10/10/2021 CLINICAL DATA:  Cough EXAM: CHEST - 2 VIEW COMPARISON:   12/23/2017 FINDINGS: The heart size and mediastinal contours are within normal limits. No focal airspace consolidation, pleural effusion, or pneumothorax. The visualized skeletal structures are unremarkable. IMPRESSION: No active cardiopulmonary disease. Electronically Signed   By: Duanne Guess D.O.   On: 10/10/2021 16:40    Procedures Procedures (including critical care time)  Medications Ordered in UC Medications - No data to display  Initial Impression / Assessment and Plan / UC Course  I have reviewed the triage vital signs and the nursing notes.  Pertinent labs & imaging results that were available during my care of the patient were reviewed by me and considered in my medical decision making (see chart for details).     Chest x-ray was negative for any acute cardiopulmonary process.  Suspect possible acute bronchitis.  Given upper respiratory symptoms are also persistent, will opt to treat with Augmentin antibiotic.  I do think patient would benefit from prednisone to decrease inflammation in chest but patient reports that her blood sugars have been in the high 200s and 300s over the past few days.  Therefore, will defer prednisone so as not to exacerbate this.  Advised patient to follow-up with PCP for blood sugar evaluation.  Will treat with guaifenesin for cough and mucus as well.  Vital signs and patient's oxygen stable at discharge.  Patient did have mildly elevated blood pressure reading on initial triage but recheck was better.  Recheck of blood pressure appears consistent with patient's baseline.  Although, patient to monitor and follow-up with PCP for any medication changes.  Neuro exam was normal and no signs of endorgan damage on exam.  Discussed return and ER precautions.  Patient verbalized understanding and was agreeable with plan. Final Clinical Impressions(s) / UC Diagnoses   Final diagnoses:  Persistent cough for 3 weeks or longer  Acute upper respiratory infection   Nausea without vomiting     Discharge Instructions      Your x-ray was normal.  You have been prescribed 3 medications to alleviate symptoms.  Please follow-up if symptoms persist or worsen.     ED Prescriptions     Medication Sig Dispense Auth. Provider   ondansetron (ZOFRAN-ODT) 4 MG disintegrating tablet Take 1 tablet (4 mg total) by mouth every 8 (eight) hours as needed for nausea or vomiting. 20 tablet [place], Charco E, [place]   amoxicillin-clavulanate (AUGMENTIN) 875-125 MG tablet Take 1 tablet by mouth every 12 (twelve) hours. 14 tablet [place], [place] E, [place]   guaiFENesin 200 MG tablet Take 1 tablet (200 mg total) by mouth every 4 (four) hours as needed for cough or to loosen phlegm. 30 suppository [place], Acie Fredrickson, [place]      PDMP not reviewed this encounter.   Gustavus Bryant, [place] 10/10/21 1735

## 2021-10-10 NOTE — Discharge Instructions (Signed)
Your x-ray was normal.  You have been prescribed 3 medications to alleviate symptoms.  Please follow-up if symptoms persist or worsen.

## 2021-10-10 NOTE — ED Triage Notes (Signed)
Pt presents to [facility] with co of uri, nausea, palpitations, sore throat, cough , lightheaded, for one week. Pt report otc motrin for symptoms and Theraflu.

## 2021-11-15 ENCOUNTER — Telehealth: Payer: Self-pay | Admitting: Family Medicine

## 2021-11-15 NOTE — Telephone Encounter (Signed)
Patient aware that request for DM specialist will be discussed at next visit.

## 2021-11-15 NOTE — Telephone Encounter (Signed)
Pt is wanting a referral to a diabetic specialist for her diabetes, she is wanting to be referred to Dr. Posey Pronto if possible. Please advise pt @ [phone]

## 2021-11-15 NOTE — Telephone Encounter (Signed)
Patient requesting referral for diabetic teaching if okay for referral patient would also like to see Dr.Patel to treat diabetes. Please advise last OV 06/20/21 upcoming appointment scheduled for 12/27/21 please advise.

## 2021-11-30 ENCOUNTER — Ambulatory Visit (INDEPENDENT_AMBULATORY_CARE_PROVIDER_SITE_OTHER): Payer: Medicare Other | Admitting: Family Medicine

## 2021-11-30 ENCOUNTER — Encounter: Payer: Self-pay | Admitting: Family Medicine

## 2021-11-30 VITALS — BP 140/76 | HR 75 | Temp 96.9°F | Ht 64.0 in | Wt 148.0 lb

## 2021-11-30 DIAGNOSIS — E782 Mixed hyperlipidemia: Secondary | ICD-10-CM

## 2021-11-30 DIAGNOSIS — E119 Type 2 diabetes mellitus without complications: Secondary | ICD-10-CM

## 2021-11-30 DIAGNOSIS — I10 Essential (primary) hypertension: Secondary | ICD-10-CM | POA: Diagnosis not present

## 2021-11-30 DIAGNOSIS — Z23 Encounter for immunization: Secondary | ICD-10-CM | POA: Diagnosis not present

## 2021-11-30 MED ORDER — PRAVASTATIN SODIUM 80 MG PO TABS: 80.0000 mg | ORAL_TABLET | Freq: Every day | ORAL | 1 refills | Status: AC

## 2021-11-30 MED ORDER — LISINOPRIL 5 MG PO TABS: 5.0000 mg | ORAL_TABLET | Freq: Every day | ORAL | 1 refills | Status: AC

## 2021-11-30 MED ORDER — AMLODIPINE BESYLATE 5 MG PO TABS: 5.0000 mg | ORAL_TABLET | Freq: Every day | ORAL | 1 refills | Status: AC

## 2021-11-30 MED ORDER — TRULICITY 0.75 MG/0.5ML ~~LOC~~ SOAJ: SUBCUTANEOUS | 6 refills | Status: AC

## 2021-11-30 MED ORDER — METFORMIN HCL 1000 MG PO TABS: 1000.0000 mg | ORAL_TABLET | Freq: Two times a day (BID) | ORAL | 3 refills | Status: AC

## 2021-11-30 NOTE — Progress Notes (Signed)
Established Patient Office Visit  Subjective   Patient ID: Sharon Leonard, female    DOB: Nov 01, 1955  Age: 66 y.o. MRN: [id number]  Chief Complaint  Patient presents with   Follow-up    6 month follow up, would like cholesterol levels checked, patient not fasting. Also would like to start back on Trulicity    HPI follow-up of hypertension, diabetes and elevated ldl cholesterol.  Patient discontinued her amlodipine and low-dose lisinopril.  She is discontinued pravastatin.  She has an elevated ASCVD risk score.  She lost coverage on the Trulicity and has been taking the metformin only.    Review of Systems  Constitutional: Negative.   HENT: Negative.    Eyes:  Negative for blurred vision, discharge and redness.  Respiratory: Negative.    Cardiovascular: Negative.   Gastrointestinal:  Negative for abdominal pain.  Genitourinary: Negative.   Musculoskeletal: Negative.  Negative for myalgias.  Skin:  Negative for rash.  Neurological:  Negative for tingling, loss of consciousness and weakness.  Endo/Heme/Allergies:  Negative for polydipsia.      Objective:     BP (!) 140/76 (BP Location: Left Arm, Patient Position: Sitting, Cuff Size: Normal)   Pulse 75   Temp (!) 96.9 F (36.1 C) (Temporal)   Ht 5\' 4"  (1.626 m)   Wt 148 lb (67.1 kg)   SpO2 95%   BMI 25.40 kg/m  BP Readings from Last 3 Encounters:  11/30/21 (!) 140/76  10/10/21 (!) 158/68  05/31/21 140/72   Wt Readings from Last 3 Encounters:  11/30/21 148 lb (67.1 kg)  05/31/21 153 lb 3.2 oz (69.5 kg)  11/22/20 155 lb (70.3 kg)      Physical Exam Constitutional:      General: She is not in acute distress.    Appearance: Normal appearance. She is not ill-appearing, toxic-appearing or diaphoretic.  HENT:     Head: Normocephalic and atraumatic.     Right Ear: External ear normal.     Left Ear: External ear normal.     Mouth/Throat:     Mouth: Mucous membranes are moist.     Pharynx: Oropharynx is clear. No  oropharyngeal exudate or posterior oropharyngeal erythema.  Eyes:     General: No scleral icterus.       Right eye: No discharge.        Left eye: No discharge.     Extraocular Movements: Extraocular movements intact.     Conjunctiva/sclera: Conjunctivae normal.     Pupils: Pupils are equal, round, and reactive to light.  Cardiovascular:     Rate and Rhythm: Normal rate and regular rhythm.  Pulmonary:     Effort: Pulmonary effort is normal. No respiratory distress.     Breath sounds: Normal breath sounds.  Musculoskeletal:     Cervical back: No rigidity or tenderness.  Skin:    General: Skin is warm and dry.  Neurological:     Mental Status: She is alert and oriented to person, place, and time.  Psychiatric:        Mood and Affect: Mood normal.        Behavior: Behavior normal.      No results found for any visits on 11/30/21.    The 10-year ASCVD risk score (Arnett DK, et al., 2019) is: 24.9%    Assessment & Plan:   Problem List Items Addressed This Visit       Cardiovascular and Mediastinum   Hypertension, benign   Relevant Medications   EPINEPHrine  0.3 mg/0.3 mL IJ SOAJ injection   pravastatin (PRAVACHOL) 80 MG tablet   lisinopril (ZESTRIL) 5 MG tablet   amLODipine (NORVASC) 5 MG tablet   Other Relevant Orders   Comprehensive metabolic panel     Endocrine   Type 2 diabetes mellitus without complication, without long-term current use of insulin (HCC) - Primary   Relevant Medications   pravastatin (PRAVACHOL) 80 MG tablet   metFORMIN (GLUCOPHAGE) 1000 MG tablet   lisinopril (ZESTRIL) 5 MG tablet   Dulaglutide (TRULICITY) 0.75 MG/0.5ML SOPN   Other Relevant Orders   Comprehensive metabolic panel   Hemoglobin A1c   Ambulatory referral to Ophthalmology     Other   Mixed hyperlipidemia   Relevant Medications   EPINEPHrine 0.3 mg/0.3 mL IJ SOAJ injection   pravastatin (PRAVACHOL) 80 MG tablet   lisinopril (ZESTRIL) 5 MG tablet   amLODipine (NORVASC) 5 MG  tablet   Other Relevant Orders   Comprehensive metabolic panel   Lipid panel   Need for influenza vaccination   Relevant Orders   Flu vaccine HIGH DOSE PF (Fluzone High dose) (Completed)    Return in about 3 months (around 03/02/2022), or Return Thursday morning after work fasting for blood work..  Explained the importance of taking statin with her elevated ASCVD risk score.  Explained that she is at higher risk for heart attack or stroke over the next 10 years treatment above problems.  Restarted pravastatin, low-dose lisinopril, amlodipine and dulaglutide.  She asked for endocrinology referral.  Suggested that we give her medications a chance to work.  If her diabetes remains poorly controlled, certainly would consider referral.    Mliss Sax, MD

## 2021-12-02 ENCOUNTER — Telehealth: Payer: Self-pay | Admitting: Family Medicine

## 2021-12-02 ENCOUNTER — Other Ambulatory Visit (INDEPENDENT_AMBULATORY_CARE_PROVIDER_SITE_OTHER): Payer: Medicare Other

## 2021-12-02 DIAGNOSIS — E119 Type 2 diabetes mellitus without complications: Secondary | ICD-10-CM

## 2021-12-02 DIAGNOSIS — E782 Mixed hyperlipidemia: Secondary | ICD-10-CM | POA: Diagnosis not present

## 2021-12-02 DIAGNOSIS — I10 Essential (primary) hypertension: Secondary | ICD-10-CM

## 2021-12-02 LAB — LIPID PANEL
Cholesterol: 155 mg/dL (ref 0–200)
HDL: 64 mg/dL (ref >39.00)
LDL Cholesterol: 75 mg/dL (ref 0–99)
NonHDL: 91.16
Total CHOL/HDL Ratio: 2
Triglycerides: 80 mg/dL (ref 0.0–149.0)
VLDL: 16 mg/dL (ref 0.0–40.0)

## 2021-12-02 LAB — COMPREHENSIVE METABOLIC PANEL
ALT: 16 U/L (ref 0–35)
AST: 14 U/L (ref 0–37)
Albumin: 4.2 g/dL (ref 3.5–5.2)
Alkaline Phosphatase: 86 U/L (ref 39–117)
BUN: 14 mg/dL (ref 6–23)
CO2: 26 mEq/L (ref 19–32)
Calcium: 9.4 mg/dL (ref 8.4–10.5)
Chloride: 103 mEq/L (ref 96–112)
Creatinine, Ser: 0.73 mg/dL (ref 0.40–1.20)
GFR: 86.41 mL/min (ref >60.00)
Glucose, Bld: 251 mg/dL — ABNORMAL HIGH (ref 70–99)
Potassium: 4.1 mEq/L (ref 3.5–5.1)
Sodium: 136 mEq/L (ref 135–145)
Total Bilirubin: 0.6 mg/dL (ref 0.2–1.2)
Total Protein: 7.2 g/dL (ref 6.0–8.3)

## 2021-12-02 LAB — HEMOGLOBIN A1C: Hgb A1c MFr Bld: 9.5 % — ABNORMAL HIGH (ref 4.6–6.5)

## 2021-12-02 NOTE — Telephone Encounter (Signed)
Pt is checking on status of her PA with her Dulaglutide (TRULICITY) 9.83 JA/2.5KN SOPN [[id number]].  She is also wanting  a script for Dexcom Continuous Glucose Monitoring to help with her diabetes.  She wants her pharmacy changed to [facility]  Address: [address], [place],  Phone: [phone] from now on.   Pt @ [phone]

## 2021-12-15 MED ORDER — DEXCOM G7 RECEIVER DEVI: 1.0000 | 0 refills | Status: AC

## 2021-12-15 MED ORDER — DEXCOM G7 SENSOR MISC: 1.0000 | 0 refills | Status: AC

## 2021-12-15 NOTE — Telephone Encounter (Signed)
Done

## 2021-12-15 NOTE — Addendum Note (Signed)
Addended by: Lynda Rainwater on: 12/15/2021 11:38 AM   Modules accepted: Orders

## 2022-02-14 ENCOUNTER — Other Ambulatory Visit: Payer: Self-pay

## 2022-02-14 ENCOUNTER — Encounter (HOSPITAL_BASED_OUTPATIENT_CLINIC_OR_DEPARTMENT_OTHER): Payer: Self-pay | Admitting: Urology

## 2022-02-14 ENCOUNTER — Emergency Department (HOSPITAL_BASED_OUTPATIENT_CLINIC_OR_DEPARTMENT_OTHER)
Admission: EM | Admit: 2022-02-14 | Discharge: 2022-02-14 | Disposition: A | Discharge status: Home / Self Care | Payer: 59 | Attending: Emergency Medicine | Admitting: Emergency Medicine

## 2022-02-14 DIAGNOSIS — M25531 Pain in right wrist: Secondary | ICD-10-CM | POA: Diagnosis not present

## 2022-02-14 DIAGNOSIS — R200 Anesthesia of skin: Secondary | ICD-10-CM | POA: Diagnosis present

## 2022-02-14 NOTE — ED Notes (Signed)
Rt. Hand numbness since yesterday

## 2022-02-14 NOTE — ED Triage Notes (Signed)
Right hand tingling since yesterday  States resolved throughout the day but got worse over night  States "feels like pins and needles in my hand"

## 2022-02-14 NOTE — ED Provider Notes (Signed)
[facility] EMERGENCY DEPARTMENT Provider Note   CSN: [id number] Arrival date & time: 02/14/22  1334     History Chief Complaint  Patient presents with   Hand Problem    HPI Sharon Leonard is a 66 y.o. female presenting for chief complaint of right hand pain.  She states that she has had pins-and-needles like shooting sensation in the right hand intermittently over the past month.  She denies fevers or chills, nausea vomiting, syncope shortness of breath.  She otherwise ambulatory tolerating p.o. intake.  No known sick contacts. .   Patient's recorded medical, surgical, social, medication list and allergies were reviewed in the Snapshot window as part of the initial history.   Review of Systems   Review of Systems  Constitutional:  Negative for chills and fever.  HENT:  Negative for ear pain and sore throat.   Eyes:  Negative for pain and visual disturbance.  Respiratory:  Negative for cough and shortness of breath.   Cardiovascular:  Negative for chest pain and palpitations.  Gastrointestinal:  Negative for abdominal pain and vomiting.  Genitourinary:  Negative for dysuria and hematuria.  Musculoskeletal:  Negative for arthralgias and back pain.  Skin:  Negative for color change and rash.  Neurological:  Negative for seizures and syncope.  All other systems reviewed and are negative.   Physical Exam Updated Vital Signs BP (!) 140/61 (BP Location: Left Arm)   Pulse 71   Temp 97.8 F (36.6 C) (Oral)   Resp 18   Ht 5\' 4"  (1.626 m)   Wt 67.1 kg   SpO2 100%   BMI 25.39 kg/m  Physical Exam Vitals and nursing note reviewed.  Constitutional:      General: She is not in acute distress.    Appearance: She is well-developed.  HENT:     Head: Normocephalic and atraumatic.  Eyes:     Conjunctiva/sclera: Conjunctivae normal.  Cardiovascular:     Rate and Rhythm: Normal rate and regular rhythm.     Heart sounds: No murmur heard. Pulmonary:     Effort:  Pulmonary effort is normal. No respiratory distress.     Breath sounds: Normal breath sounds.  Abdominal:     Palpations: Abdomen is soft.     Tenderness: There is no abdominal tenderness.  Musculoskeletal:        General: Tenderness (Reproducible tenderness palpation over the right wrist with hand extension.) present. No swelling.     Cervical back: Neck supple.  Skin:    General: Skin is warm and dry.     Capillary Refill: Capillary refill takes less than 2 seconds.  Neurological:     Mental Status: She is alert.  Psychiatric:        Mood and Affect: Mood normal.      ED Course/ Medical Decision Making/ A&P    Procedures Procedures   Medications Ordered in ED Medications - No data to display  Medical Decision Making:    Sharon Leonard is a 66 y.o. female who presented to the ED today with right hand numbness tingling, sharp painful sensation detailed above.     Patient's presentation is complicated by their history of advanced age.  Patient placed on continuous vitals and telemetry monitoring while in ED which was reviewed periodically.   Complete initial physical exam performed, notably the patient  was hemodynamically stable no acute distress.      Reviewed and confirmed nursing documentation for past medical history, family history, social history.  Initial Assessment:   Patient's history of present on physical Zenapax is most consistent with carpal tunnel syndrome.  Considered CVA, septic arthritis, cellulitis.  The seem grossly less likely given the patient's clinical findings. She works as a Youth worker heavy patients. I informed her that this is likely to exacerbate her syndrome.  Will place patient in wrist extension splint applied after follow-up with PCP in the outpatient setting.  She may need follow-up with hand surgery as well.  Referral placed.   Disposition:  I have considered need for hospitalization, however, considering all of the above, I believe  this patient is stable for discharge at this time.  Patient/family educated about specific return precautions for given chief complaint and symptoms.  Patient/family educated about follow-up with PCP.     Patient/family expressed understanding of return precautions and need for follow-up. Patient spoken to regarding all imaging and laboratory results and appropriate follow up for these results. All education provided in verbal form with additional information in written form. Time was allowed for answering of patient questions. Patient discharged.    Emergency Department Medication Summary:   Medications - No data to display     Clinical Impression:  1. Hand numbness      Discharge   Final Clinical Impression(s) / ED Diagnoses Final diagnoses:  Hand numbness    Rx / DC Orders ED Discharge Orders     None         Glyn Ade, MD 02/14/22 1909

## 2022-03-03 ENCOUNTER — Ambulatory Visit: Payer: 59 | Admitting: Family Medicine

## 2022-03-03 ENCOUNTER — Telehealth: Payer: Self-pay | Admitting: Family Medicine

## 2022-03-03 NOTE — Telephone Encounter (Signed)
1st no show, fee waived, letter sent 

## 2022-03-03 NOTE — Telephone Encounter (Signed)
NO show 1/04 for OV with Dr. Ethelene Hal. This is her first, letter sent

## 2022-03-10 ENCOUNTER — Encounter (HOSPITAL_BASED_OUTPATIENT_CLINIC_OR_DEPARTMENT_OTHER): Payer: Self-pay | Admitting: Pediatrics

## 2022-03-10 ENCOUNTER — Emergency Department (HOSPITAL_BASED_OUTPATIENT_CLINIC_OR_DEPARTMENT_OTHER)
Admission: EM | Admit: 2022-03-10 | Discharge: 2022-03-10 | Disposition: A | Discharge status: Home / Self Care | Payer: Medicare Other | Attending: Emergency Medicine | Admitting: Emergency Medicine

## 2022-03-10 ENCOUNTER — Other Ambulatory Visit: Payer: Self-pay

## 2022-03-10 DIAGNOSIS — Z20822 Contact with and (suspected) exposure to covid-19: Secondary | ICD-10-CM | POA: Diagnosis not present

## 2022-03-10 DIAGNOSIS — Z7984 Long term (current) use of oral hypoglycemic drugs: Secondary | ICD-10-CM | POA: Diagnosis not present

## 2022-03-10 DIAGNOSIS — Z91040 Latex allergy status: Secondary | ICD-10-CM | POA: Diagnosis not present

## 2022-03-10 DIAGNOSIS — Z794 Long term (current) use of insulin: Secondary | ICD-10-CM | POA: Insufficient documentation

## 2022-03-10 DIAGNOSIS — Z79899 Other long term (current) drug therapy: Secondary | ICD-10-CM | POA: Insufficient documentation

## 2022-03-10 DIAGNOSIS — I10 Essential (primary) hypertension: Secondary | ICD-10-CM | POA: Insufficient documentation

## 2022-03-10 DIAGNOSIS — E1165 Type 2 diabetes mellitus with hyperglycemia: Secondary | ICD-10-CM | POA: Diagnosis not present

## 2022-03-10 DIAGNOSIS — R739 Hyperglycemia, unspecified: Secondary | ICD-10-CM

## 2022-03-10 DIAGNOSIS — M791 Myalgia, unspecified site: Secondary | ICD-10-CM | POA: Diagnosis present

## 2022-03-10 DIAGNOSIS — Z7982 Long term (current) use of aspirin: Secondary | ICD-10-CM | POA: Insufficient documentation

## 2022-03-10 LAB — CBC WITH DIFFERENTIAL/PLATELET
Abs Immature Granulocytes: 0.02 10*3/uL (ref 0.00–0.07)
Basophils Absolute: 0.1 10*3/uL (ref 0.0–0.1)
Basophils Relative: 1 %
Eosinophils Absolute: 0.2 10*3/uL (ref 0.0–0.5)
Eosinophils Relative: 3 %
HCT: 38.9 % (ref 36.0–46.0)
Hemoglobin: 13.2 g/dL (ref 12.0–15.0)
Immature Granulocytes: 0 %
Lymphocytes Relative: 34 %
Lymphs Abs: 2.4 10*3/uL (ref 0.7–4.0)
MCH: 27.4 pg (ref 26.0–34.0)
MCHC: 33.9 g/dL (ref 30.0–36.0)
MCV: 80.7 fL (ref 80.0–100.0)
Monocytes Absolute: 0.6 10*3/uL (ref 0.1–1.0)
Monocytes Relative: 9 %
Neutro Abs: 3.7 10*3/uL (ref 1.7–7.7)
Neutrophils Relative %: 53 %
Platelets: 316 10*3/uL (ref 150–400)
RBC: 4.82 MIL/uL (ref 3.87–5.11)
RDW: 12.6 % (ref 11.5–15.5)
WBC: 6.9 10*3/uL (ref 4.0–10.5)
nRBC: 0 % (ref 0.0–0.2)

## 2022-03-10 LAB — URINALYSIS, ROUTINE W REFLEX MICROSCOPIC
Bilirubin Urine: NEGATIVE
Glucose, UA: NEGATIVE mg/dL
Hgb urine dipstick: NEGATIVE
Ketones, ur: NEGATIVE mg/dL
Nitrite: NEGATIVE
Protein, ur: NEGATIVE mg/dL
Specific Gravity, Urine: 1.01 (ref 1.005–1.030)
pH: 5.5 (ref 5.0–8.0)

## 2022-03-10 LAB — COMPREHENSIVE METABOLIC PANEL
ALT: 15 U/L (ref 0–44)
AST: 15 U/L (ref 15–41)
Albumin: 4.1 g/dL (ref 3.5–5.0)
Alkaline Phosphatase: 84 U/L (ref 38–126)
Anion gap: 9 (ref 5–15)
BUN: 18 mg/dL (ref 8–23)
CO2: 25 mmol/L (ref 22–32)
Calcium: 9.2 mg/dL (ref 8.9–10.3)
Chloride: 98 mmol/L (ref 98–111)
Creatinine, Ser: 0.69 mg/dL (ref 0.44–1.00)
GFR, Estimated: >60 mL/min (ref >60)
Glucose, Bld: 216 mg/dL — ABNORMAL HIGH (ref 70–99)
Potassium: 4 mmol/L (ref 3.5–5.1)
Sodium: 132 mmol/L — ABNORMAL LOW (ref 135–145)
Total Bilirubin: 0.3 mg/dL (ref 0.3–1.2)
Total Protein: 8 g/dL (ref 6.5–8.1)

## 2022-03-10 LAB — RESP PANEL BY RT-PCR (RSV, FLU A&B, COVID)  RVPGX2
Influenza A by PCR: NEGATIVE
Influenza B by PCR: NEGATIVE
Resp Syncytial Virus by PCR: NEGATIVE
SARS Coronavirus 2 by RT PCR: NEGATIVE

## 2022-03-10 LAB — URINALYSIS, MICROSCOPIC (REFLEX): RBC / HPF: NONE SEEN RBC/hpf (ref 0–5)

## 2022-03-10 LAB — CBG MONITORING, ED
Glucose-Capillary: 221 mg/dL — ABNORMAL HIGH (ref 70–99)
Glucose-Capillary: 161 mg/dL — ABNORMAL HIGH (ref 70–99)

## 2022-03-10 MED ORDER — LACTATED RINGERS IV BOLUS
500.0000 mL | Freq: Once | INTRAVENOUS | Status: AC
  Administered 2022-03-10: 500 mL via INTRAVENOUS

## 2022-03-10 NOTE — ED Triage Notes (Signed)
C/O nausea, body aches, weakness, headache, light headedness, and high blood sugars for about 2 weeks now. Reports was on trulicity but has not had it for 6 months. Stated she was told by a nutritionist that she needs insulin. BGL in triage 221, last ate earlier this morning.

## 2022-03-10 NOTE — ED Provider Notes (Signed)
[facility] EMERGENCY DEPARTMENT Provider Note   CSN: [id number] Arrival date & time: 03/10/22  1308     History  Chief Complaint  Patient presents with   Generalized Body Aches   Nausea   Hyperglycemia    Sharon Leonard is a 67 y.o. female.  HPI     67yo female with history of DM, htn who presents with concern for body aches, hyperglycemia.  Is on metformin 1000mg  BID, was on trulicity .75mg  weekly in the past but lost coverage and had not used it for about a year prior to 1/3 outpatient appointment.  Plan to add on ozempic but have not had it yet,waiting for approval.  Insurance would not pay for the trulicity.    Started pioglitazone 15mg  QD last Wednesday   When on trulicity glucose was better controlled, today earlier was in 389 this AM, then 200s  Feeling lightheaded, sleepy back hurting, achy. No energy. Can't hardly function. Feeling that way for about 2 weeks.  No nausea, no diarrhea, no black or bloody stools.  No chest pain or dyspnea.  No heart racing.  Has a little burning with urination, thinks from yeast infection, on cream for gyn.  Burning from outside irritation.  No fever. No congestion, sore throat, cough.      Past Medical History:  Diagnosis Date   Arthritis    hips- bilateral   Diabetes mellitus without complication ([place])    History of hiatal hernia    History of kidney stones    passed spont.    Hypertension     Home Medications Prior to Admission medications   Medication Sig Start Date End Date Taking? Authorizing Provider  amLODipine (NORVASC) 5 MG tablet Take 1 tablet (5 mg total) by mouth daily. 11/30/21   Libby Maw, MD  aspirin 325 MG tablet Take 1 tablet (325 mg total) by mouth daily. 10/25/16   Marybelle Killings, MD  cetirizine (ZYRTEC) 10 MG tablet Take 10 mg by mouth daily as needed for allergies.     [provider]  Continuous Blood Gluc Receiver (DEXCOM G7 RECEIVER) DEVI 1 Device by Does not apply route  as directed. 12/15/21   Libby Maw, MD  Continuous Blood Gluc Sensor ([place]) MISC 1 each by Does not apply route as directed. Use to monitor blood sugar, change after 10 days 12/15/21   Libby Maw, MD  Dulaglutide (TRULICITY) 8.10 FB/5.1WC SOPN INJECT 0.75 MG INTO THE SKIN EVERY 7 DAYS FOR 7 DAYS. 11/30/21   Libby Maw, MD  EPINEPHrine 0.3 mg/0.3 mL IJ SOAJ injection     [provider]  Esomeprazole Magnesium ([place]) [place] Patient not taking: Reported on 11/30/2021    [provider]  lisinopril (ZESTRIL) 5 MG tablet Take 1 tablet (5 mg total) by mouth daily. 11/30/21   Libby Maw, MD  metFORMIN (GLUCOPHAGE) 1000 MG tablet Take 1 tablet (1,000 mg total) by mouth 2 (two) times daily with a meal. 11/30/21   Libby Maw, MD  pravastatin (PRAVACHOL) 80 MG tablet Take 1 tablet (80 mg total) by mouth daily. 11/30/21   Libby Maw, MD  tobramycin-dexamethasone [facility]) ophthalmic solution Place 1 drop into both eyes 4 (four) times daily as needed. Patient not taking: Reported on 11/30/2021    [provider]      Allergies    Ezetimibe, Oxycodone, Raspberry, Strawberry extract, Sulfa antibiotics, Tramadol, Latex, and Shellfish allergy    Review of  Systems   Review of Systems  Physical Exam Updated Vital Signs BP (!) 123/53   Pulse 65   Temp 98.3 F (36.8 C) (Oral)   Resp 18   Ht 5\' 4"  (1.626 m)   Wt 63 kg   SpO2 100%   BMI 23.86 kg/m  Physical Exam Vitals and nursing note reviewed.  Constitutional:      General: She is not in acute distress.    Appearance: She is well-developed. She is not diaphoretic.  HENT:     Head: Normocephalic and atraumatic.  Eyes:     Conjunctiva/sclera: Conjunctivae normal.  Cardiovascular:     Rate and Rhythm: Normal rate and regular rhythm.     Heart sounds: Normal heart sounds. No murmur heard.    No friction rub. No gallop.  Pulmonary:      Effort: Pulmonary effort is normal. No respiratory distress.     Breath sounds: Normal breath sounds. No wheezing or rales.  Abdominal:     General: There is no distension.     Palpations: Abdomen is soft.     Tenderness: There is no abdominal tenderness. There is no guarding.  Musculoskeletal:        General: No tenderness.     Cervical back: Normal range of motion.  Skin:    General: Skin is warm and dry.     Findings: No erythema or rash.  Neurological:     Mental Status: She is alert and oriented to person, place, and time.     ED Results / Procedures / Treatments   Labs (all labs ordered are listed, but only abnormal results are displayed) Labs Reviewed  COMPREHENSIVE METABOLIC PANEL - Abnormal; Notable for the following components:      Result Value   Sodium 132 (*)    Glucose, Bld 216 (*)    All other components within normal limits  URINALYSIS, ROUTINE W REFLEX MICROSCOPIC - Abnormal; Notable for the following components:   Color, Urine STRAW (*)    Leukocytes,Ua TRACE (*)    All other components within normal limits  URINALYSIS, MICROSCOPIC (REFLEX) - Abnormal; Notable for the following components:   Bacteria, UA RARE (*)    All other components within normal limits  CBG MONITORING, ED - Abnormal; Notable for the following components:   Glucose-Capillary 221 (*)    All other components within normal limits  CBG MONITORING, ED - Abnormal; Notable for the following components:   Glucose-Capillary 161 (*)    All other components within normal limits  RESP PANEL BY RT-PCR (RSV, FLU A&B, COVID)  RVPGX2  CBC WITH DIFFERENTIAL/PLATELET    EKG EKG Interpretation  Date/Time:  Thursday March 10 2022 15:03:43 EST Ventricular Rate:  70 PR Interval:  164 QRS Duration: 95 QT Interval:  410 QTC Calculation: 443 R Axis:   64 Text Interpretation: Sinus rhythm Low voltage, precordial leads No significant change since last tracing Confirmed by 10-15-1998 (Alvira Monday) on  03/10/2022 4:35:10 PM  Radiology No results found.  Procedures Procedures    Medications Ordered in ED Medications  lactated ringers bolus 500 mL (0 mLs Intravenous Stopped 03/10/22 1551)    ED Course/ Medical Decision Making/ A&P                            66yo female with history of DM, htn who presents with concern for body aches, hyperglycemia.  Differential diagnosis includes anemia, electrolyte abnormality, cardiac abnormality, infection,  hypothyroidism, other toxic/metabolic abnormalities. No cough, cp, or dyspnea doubt ACS/PE/pneumonia. Labs completed and personally evaluated by me.  Urinalysis shows no sign of UTI. CBC showed no sign of anemia or leukocytosis.  Electrolytes show mild hyperglycemia 221 without signs of DKA.Marland Kitchen  EKG without arrythmia.  Negative covid, influenza, rsv.    Discussed it is possible she has symptoms related to dehydration in setting of hyperglycemia. She just started pioglitazone, discuss it is reasonable to increase dosing but also reasonable to discuss more with PCP.           Final Clinical Impression(s) / ED Diagnoses Final diagnoses:  Hyperglycemia    Rx / DC Orders ED Discharge Orders     None         Gareth Morgan, MD 03/12/22 1029

## 2022-03-10 NOTE — Discharge Instructions (Signed)
Can consider pioglitazone increase to 30mg  per day from 15mg , however because you just started the medication one week ago it is reasonable to wait and discuss medication adjustments more with your primary care doctor.

## 2022-05-31 ENCOUNTER — Other Ambulatory Visit: Payer: Self-pay | Admitting: Family Medicine

## 2022-05-31 DIAGNOSIS — E782 Mixed hyperlipidemia: Secondary | ICD-10-CM

## 2022-05-31 DIAGNOSIS — I10 Essential (primary) hypertension: Secondary | ICD-10-CM

## 2022-05-31 DIAGNOSIS — E119 Type 2 diabetes mellitus without complications: Secondary | ICD-10-CM

## 2023-09-26 ENCOUNTER — Telehealth: Payer: Self-pay

## 2023-09-26 NOTE — Telephone Encounter (Signed)
 Patient was identified as falling into the [place] Measure - Diabetes.   Patient was: Patient is not currently using our practice.

## 2024-02-18 ENCOUNTER — Encounter (HOSPITAL_BASED_OUTPATIENT_CLINIC_OR_DEPARTMENT_OTHER): Payer: Self-pay

## 2024-02-18 ENCOUNTER — Emergency Department (HOSPITAL_BASED_OUTPATIENT_CLINIC_OR_DEPARTMENT_OTHER)

## 2024-02-18 ENCOUNTER — Other Ambulatory Visit: Payer: Self-pay

## 2024-02-18 ENCOUNTER — Emergency Department (HOSPITAL_BASED_OUTPATIENT_CLINIC_OR_DEPARTMENT_OTHER)
Admission: EM | Admit: 2024-02-18 | Discharge: 2024-02-18 | Disposition: A | Discharge status: Home / Self Care | Attending: Emergency Medicine | Admitting: Emergency Medicine

## 2024-02-18 DIAGNOSIS — Z91040 Latex allergy status: Secondary | ICD-10-CM | POA: Diagnosis not present

## 2024-02-18 DIAGNOSIS — B9789 Other viral agents as the cause of diseases classified elsewhere: Secondary | ICD-10-CM | POA: Diagnosis not present

## 2024-02-18 DIAGNOSIS — J45901 Unspecified asthma with (acute) exacerbation: Secondary | ICD-10-CM | POA: Diagnosis not present

## 2024-02-18 DIAGNOSIS — Z7982 Long term (current) use of aspirin: Secondary | ICD-10-CM | POA: Diagnosis not present

## 2024-02-18 DIAGNOSIS — Z79899 Other long term (current) drug therapy: Secondary | ICD-10-CM | POA: Diagnosis not present

## 2024-02-18 DIAGNOSIS — J989 Respiratory disorder, unspecified: Secondary | ICD-10-CM | POA: Insufficient documentation

## 2024-02-18 DIAGNOSIS — R062 Wheezing: Secondary | ICD-10-CM | POA: Diagnosis present

## 2024-02-18 LAB — CBC
HCT: 35.9 % — ABNORMAL LOW (ref 36.0–46.0)
Hemoglobin: 12.6 g/dL (ref 12.0–15.0)
MCH: 28.3 pg (ref 26.0–34.0)
MCHC: 35.1 g/dL (ref 30.0–36.0)
MCV: 80.7 fL (ref 80.0–100.0)
Platelets: 265 K/uL (ref 150–400)
RBC: 4.45 MIL/uL (ref 3.87–5.11)
RDW: 12.7 % (ref 11.5–15.5)
WBC: 4 K/uL (ref 4.0–10.5)
nRBC: 0 % (ref 0.0–0.2)

## 2024-02-18 LAB — RESP PANEL BY RT-PCR (RSV, FLU A&B, COVID)  RVPGX2
Influenza A by PCR: NEGATIVE
Influenza B by PCR: NEGATIVE
Resp Syncytial Virus by PCR: NEGATIVE
SARS Coronavirus 2 by RT PCR: NEGATIVE

## 2024-02-18 LAB — BASIC METABOLIC PANEL WITH GFR
Anion gap: 13 (ref 5–15)
BUN: 11 mg/dL (ref 8–23)
CO2: 22 mmol/L (ref 22–32)
Calcium: 9.1 mg/dL (ref 8.9–10.3)
Chloride: 105 mmol/L (ref 98–111)
Creatinine, Ser: 0.79 mg/dL (ref 0.44–1.00)
GFR, Estimated: >60 mL/min
Glucose, Bld: 143 mg/dL — ABNORMAL HIGH (ref 70–99)
Potassium: 4 mmol/L (ref 3.5–5.1)
Sodium: 139 mmol/L (ref 135–145)

## 2024-02-18 MED ORDER — BENZONATATE 100 MG PO CAPS: 200.0000 mg | ORAL_CAPSULE | Freq: Three times a day (TID) | ORAL | 0 refills | Status: AC | PRN

## 2024-02-18 MED ORDER — METHYLPREDNISOLONE SODIUM SUCC 125 MG IJ SOLR: 125.0000 mg | Freq: Once | INTRAMUSCULAR | Status: DC

## 2024-02-18 MED ORDER — DEXAMETHASONE SOD PHOSPHATE PF 10 MG/ML IJ SOLN
10.0000 mg | Freq: Once | INTRAMUSCULAR | Status: AC
  Administered 2024-02-18: 10 mg via INTRAMUSCULAR

## 2024-02-18 NOTE — ED Notes (Signed)
 Patient transferred from waiting room to ED treatment room. Assuming pt care at this time.

## 2024-02-18 NOTE — ED Triage Notes (Signed)
 BIB [facility].  Reports SHOB, cough, congestion, fatigue for 1 week.  No obvious signs of respiratory distress in triage

## 2024-02-18 NOTE — Discharge Instructions (Signed)
 Follow up with your Physician for recheck.  Use your Inhaler 2 puffs every 4 hours.Return if any problems.

## 2024-02-18 NOTE — ED Provider Notes (Signed)
 " [facility] Provider Note   CSN: [id number] Arrival date & time: 02/18/24  1055     Patient presents with: Shortness of Breath   Sharon Leonard is a 68 y.o. female.   Patient complains of a cough and congestion.  Patient has been using her albuterol inhaler without relief.  Patient reports that she has had the cough for over a week.  Patient felt like today her albuterol inhaler did not help with the symptoms.  Patient states she has had asthma attacks in the past but the albuterol normally works for her.  Patient reports having a fever and chills earlier in the week she has not had any nausea or vomiting.  Patient denies any chest pain.  Patient reports she was wheezing earlier.  She feels like wheezing has resolved   Shortness of Breath Associated symptoms: wheezing        Prior to Admission medications  Medication Sig Start Date End Date Taking? Authorizing Provider  benzonatate  (TESSALON ) 100 MG capsule Take 2 capsules (200 mg total) by mouth 3 (three) times daily as needed for cough. 02/18/24  Yes Farhaan Mabee K, PA-C  amLODipine  (NORVASC ) 5 MG tablet Take 1 tablet (5 mg total) by mouth daily. 11/30/21   Berneta Elsie Sayre, MD  aspirin  325 MG tablet Take 1 tablet (325 mg total) by mouth daily. 10/25/16   Barbarann Oneil BROCKS, MD  cetirizine (ZYRTEC) 10 MG tablet Take 10 mg by mouth daily as needed for allergies.     [provider]  Continuous Blood Gluc Receiver (DEXCOM G7 RECEIVER) DEVI 1 Device by Does not apply route as directed. 12/15/21   Berneta Elsie Sayre, MD  Continuous Blood Gluc Sensor (DEXCOM G7 SENSOR) MISC 1 each by Does not apply route as directed. Use to monitor blood sugar, change after 10 days 12/15/21   Berneta Elsie Sayre, MD  Dulaglutide  (TRULICITY ) 0.75 MG/0.5ML SOPN INJECT 0.75 MG INTO THE SKIN EVERY 7 DAYS FOR 7 DAYS. 11/30/21   Berneta Elsie Sayre, MD  EPINEPHrine 0.3 mg/0.3 mL IJ SOAJ  injection     [provider]  Esomeprazole Magnesium (NEXIUM PO) NexIUM Patient not taking: Reported on 11/30/2021    [provider]  lisinopril  (ZESTRIL ) 5 MG tablet Take 1 tablet (5 mg total) by mouth daily. 11/30/21   Berneta Elsie Sayre, MD  metFORMIN  (GLUCOPHAGE ) 1000 MG tablet Take 1 tablet (1,000 mg total) by mouth 2 (two) times daily with a meal. 11/30/21   Berneta Elsie Sayre, MD  pravastatin  (PRAVACHOL ) 80 MG tablet Take 1 tablet (80 mg total) by mouth daily. 11/30/21   Berneta Elsie Sayre, MD  tobramycin-dexamethasone  [facility]) ophthalmic solution Place 1 drop into both eyes 4 (four) times daily as needed. Patient not taking: Reported on 11/30/2021    [provider]    Allergies: Ezetimibe, Oxycodone , Raspberry, Strawberry extract, Sulfa antibiotics, Tramadol , Latex, and Shellfish allergy    Review of Systems  Respiratory:  Positive for shortness of breath and wheezing.   All other systems reviewed and are negative.   Updated Vital Signs BP (!) 149/64 (BP Location: Right Arm)   Pulse 61   Temp 98.3 F (36.8 C) (Oral)   Resp 16   Ht 5' 4 (1.626 m)   Wt 75.8 kg   SpO2 100%   BMI 28.68 kg/m   Physical Exam Vitals and nursing note reviewed.  Constitutional:      Appearance: She is well-developed.  HENT:  Head: Normocephalic.  Cardiovascular:     Rate and Rhythm: Normal rate.  Pulmonary:     Effort: Pulmonary effort is normal.     Breath sounds: No decreased breath sounds or wheezing.  Chest:     Chest wall: No tenderness.  Abdominal:     General: There is no distension.  Musculoskeletal:        General: Normal range of motion.     Cervical back: Normal range of motion.  Skin:    General: Skin is warm.  Neurological:     General: No focal deficit present.     Mental Status: She is alert and oriented to person, place, and time.     (all labs ordered are listed, but only abnormal results are displayed) Labs Reviewed   BASIC METABOLIC PANEL WITH GFR - Abnormal; Notable for the following components:      Result Value   Glucose, Bld 143 (*)    All other components within normal limits  CBC - Abnormal; Notable for the following components:   HCT 35.9 (*)    All other components within normal limits  RESP PANEL BY RT-PCR (RSV, FLU A&B, COVID)  [id number]    EKG: None  Radiology: DG Chest 2 View Result Date: 02/18/2024 EXAM: 2 VIEW(S) XRAY OF THE CHEST 02/18/2024 11:25:00 AM COMPARISON: 08/14/2023 CLINICAL HISTORY: SHOB FINDINGS: LUNGS AND PLEURA: No focal pulmonary opacity. No pleural effusion. No pneumothorax. HEART AND MEDIASTINUM: No acute abnormality of the cardiac and mediastinal silhouettes. BONES AND SOFT TISSUES: Moderate degenerative changes noted at both acromioclavicular joints. Curvature of the thoracic spine is convexed towards the right, mild. IMPRESSION: 1. No acute findings. Electronically signed by: Waddell Calk MD 02/18/2024 11:40 AM EST RP Workstation: [id number]     Procedures   Medications Ordered in the ED  dexamethasone  (DECADRON ) injection 10 mg (10 mg Intramuscular Given 02/18/24 1512)                                    Medical Decision Making Patient complains of having a cough for the past week.  Patient felt like she was having an asthma attack today that did not resolve with the use of her inhaler patient reports that she has plenty of butyryl at home  Amount and/or Complexity of Data Reviewed Labs: ordered. Decision-making details documented in ED Course.    Details: Labs ordered reviewed and interpreted for influenza COVID and RSV are negative Radiology: ordered and independent interpretation performed. Decision-making details documented in ED Course.    Details: Chest x-ray ordered reviewed and interpreted chest x-ray shows no acute findings ECG/medicine tests: ordered and independent interpretation performed. Decision-making details documented in ED Course.     Details: EKG shows normal sinus normal EKG  Risk Prescription drug management. Risk Details: Patient does not want to go on oral steroids.  She states this causes her glucose to increase.  Patient is agreeable to an injection of steroids here.  Patient is given injection of Decadron .  Patient is advised to use her albuterol inhaler 2 puffs every 4 hours.  Patient is given a prescription for Tessalon  to see if this helps with her cough.  Patient is discharged in stable condition        Final diagnoses:  Moderate asthma with exacerbation, unspecified whether persistent  Viral respiratory illness    ED Discharge Orders  Ordered    benzonatate  (TESSALON ) 100 MG capsule  3 times daily PRN        02/18/24 1502            An After Visit Summary was printed and given to the patient.    Flint Sonny POUR, PA-C 02/18/24 2145  "
